# Patient Record
Sex: Female | Born: 1965
Health system: Southern US, Community
[De-identification: ages and names within clinical notes are randomized; demographics above are authoritative.]

## PROBLEM LIST (undated history)

## (undated) DIAGNOSIS — J309 Allergic rhinitis, unspecified: Secondary | ICD-10-CM

## (undated) HISTORY — DX: Allergic rhinitis, unspecified: J30.9

---

## 2004-12-16 ENCOUNTER — Ambulatory Visit: Payer: Self-pay | Admitting: Family Medicine

## 2006-10-22 ENCOUNTER — Ambulatory Visit: Payer: Self-pay | Admitting: Physician Assistant

## 2012-08-10 ENCOUNTER — Telehealth: Payer: Self-pay

## 2012-08-10 NOTE — Telephone Encounter (Signed)
error 

## 2015-05-10 ENCOUNTER — Ambulatory Visit
Admission: RE | Admit: 2015-05-10 | Discharge: 2015-05-10 | Disposition: A | Discharge status: Home / Self Care | Payer: BLUE CROSS/BLUE SHIELD | Source: Ambulatory Visit | Attending: Otolaryngology | Admitting: Otolaryngology

## 2015-05-10 ENCOUNTER — Other Ambulatory Visit: Payer: Self-pay | Admitting: Otolaryngology

## 2015-05-10 DIAGNOSIS — J328 Other chronic sinusitis: Secondary | ICD-10-CM

## 2016-02-25 DIAGNOSIS — Z1211 Encounter for screening for malignant neoplasm of colon: Secondary | ICD-10-CM | POA: Insufficient documentation

## 2016-02-25 DIAGNOSIS — F17200 Nicotine dependence, unspecified, uncomplicated: Secondary | ICD-10-CM | POA: Insufficient documentation

## 2016-02-25 DIAGNOSIS — E785 Hyperlipidemia, unspecified: Secondary | ICD-10-CM | POA: Insufficient documentation

## 2016-09-28 DIAGNOSIS — Z0000 Encounter for general adult medical examination without abnormal findings: Secondary | ICD-10-CM | POA: Insufficient documentation

## 2017-04-23 DIAGNOSIS — M5126 Other intervertebral disc displacement, lumbar region: Secondary | ICD-10-CM | POA: Insufficient documentation

## 2017-05-31 ENCOUNTER — Other Ambulatory Visit: Payer: Self-pay | Admitting: Orthopedic Surgery

## 2017-05-31 DIAGNOSIS — M545 Low back pain, unspecified: Secondary | ICD-10-CM

## 2017-06-03 ENCOUNTER — Ambulatory Visit (INDEPENDENT_AMBULATORY_CARE_PROVIDER_SITE_OTHER): Payer: Self-pay | Admitting: Orthopaedic Surgery

## 2017-06-04 ENCOUNTER — Ambulatory Visit
Admission: RE | Admit: 2017-06-04 | Discharge: 2017-06-04 | Disposition: A | Discharge status: Home / Self Care | Payer: BLUE CROSS/BLUE SHIELD | Source: Ambulatory Visit | Attending: Orthopedic Surgery | Admitting: Orthopedic Surgery

## 2017-06-04 VITALS — BP 126/81 | HR 94

## 2017-06-04 DIAGNOSIS — M5126 Other intervertebral disc displacement, lumbar region: Secondary | ICD-10-CM

## 2017-06-04 DIAGNOSIS — M545 Low back pain, unspecified: Secondary | ICD-10-CM

## 2017-06-04 MED ORDER — IOPAMIDOL (ISOVUE-M 200) INJECTION 41%
15.0000 mL | Freq: Once | INTRAMUSCULAR | Status: AC
  Administered 2017-06-04: 15 mL via INTRATHECAL

## 2017-06-04 MED ORDER — ONDANSETRON HCL 4 MG/2ML IJ SOLN
4.0000 mg | Freq: Once | INTRAMUSCULAR | Status: AC
  Administered 2017-06-04: 4 mg via INTRAMUSCULAR

## 2017-06-04 MED ORDER — DIAZEPAM 5 MG PO TABS
10.0000 mg | ORAL_TABLET | Freq: Once | ORAL | Status: AC
  Administered 2017-06-04: 10 mg via ORAL

## 2017-06-04 MED ORDER — MEPERIDINE HCL 100 MG/ML IJ SOLN
100.0000 mg | Freq: Once | INTRAMUSCULAR | Status: AC
  Administered 2017-06-04: 100 mg via INTRAMUSCULAR

## 2017-06-04 NOTE — Discharge Instructions (Signed)

## 2017-07-02 ENCOUNTER — Other Ambulatory Visit: Payer: Self-pay | Admitting: Orthopedic Surgery

## 2017-07-02 DIAGNOSIS — D4120 Neoplasm of uncertain behavior of unspecified ureter: Principal | ICD-10-CM

## 2017-07-02 DIAGNOSIS — D4100 Neoplasm of uncertain behavior of unspecified kidney: Secondary | ICD-10-CM

## 2017-07-05 ENCOUNTER — Ambulatory Visit
Admission: RE | Admit: 2017-07-05 | Discharge: 2017-07-05 | Disposition: A | Discharge status: Home / Self Care | Payer: BLUE CROSS/BLUE SHIELD | Source: Ambulatory Visit | Attending: Orthopedic Surgery | Admitting: Orthopedic Surgery

## 2017-07-05 ENCOUNTER — Other Ambulatory Visit (HOSPITAL_COMMUNITY): Payer: Self-pay | Admitting: Orthopedic Surgery

## 2017-07-05 DIAGNOSIS — D4100 Neoplasm of uncertain behavior of unspecified kidney: Secondary | ICD-10-CM

## 2017-07-05 DIAGNOSIS — M545 Low back pain, unspecified: Secondary | ICD-10-CM

## 2017-07-05 DIAGNOSIS — D4120 Neoplasm of uncertain behavior of unspecified ureter: Principal | ICD-10-CM

## 2017-07-05 MED ORDER — IOPAMIDOL (ISOVUE-300) INJECTION 61%
100.0000 mL | Freq: Once | INTRAVENOUS | Status: AC | PRN
  Administered 2017-07-05: 100 mL via INTRAVENOUS

## 2017-07-12 ENCOUNTER — Encounter (HOSPITAL_COMMUNITY)
Admission: RE | Admit: 2017-07-12 | Discharge: 2017-07-12 | Disposition: A | Discharge status: Home / Self Care | Payer: BLUE CROSS/BLUE SHIELD | Source: Ambulatory Visit | Attending: Orthopedic Surgery | Admitting: Orthopedic Surgery

## 2017-07-12 DIAGNOSIS — M545 Low back pain, unspecified: Secondary | ICD-10-CM

## 2017-07-12 MED ORDER — TECHNETIUM TC 99M MEDRONATE IV KIT
21.6000 | PACK | Freq: Once | INTRAVENOUS | Status: AC | PRN
  Administered 2017-07-12: 21.6 via INTRAVENOUS

## 2017-09-20 DIAGNOSIS — M5136 Other intervertebral disc degeneration, lumbar region: Secondary | ICD-10-CM | POA: Insufficient documentation

## 2017-11-26 DIAGNOSIS — M545 Low back pain: Secondary | ICD-10-CM | POA: Diagnosis not present

## 2017-11-26 DIAGNOSIS — M79605 Pain in left leg: Secondary | ICD-10-CM | POA: Diagnosis not present

## 2017-12-15 DIAGNOSIS — M25552 Pain in left hip: Secondary | ICD-10-CM | POA: Diagnosis not present

## 2017-12-15 DIAGNOSIS — S76302S Unspecified injury of muscle, fascia and tendon of the posterior muscle group at thigh level, left thigh, sequela: Secondary | ICD-10-CM | POA: Diagnosis not present

## 2017-12-20 DIAGNOSIS — M1612 Unilateral primary osteoarthritis, left hip: Secondary | ICD-10-CM | POA: Diagnosis not present

## 2017-12-20 DIAGNOSIS — S76319A Strain of muscle, fascia and tendon of the posterior muscle group at thigh level, unspecified thigh, initial encounter: Secondary | ICD-10-CM | POA: Diagnosis not present

## 2017-12-29 DIAGNOSIS — R262 Difficulty in walking, not elsewhere classified: Secondary | ICD-10-CM | POA: Diagnosis not present

## 2017-12-29 DIAGNOSIS — M545 Low back pain: Secondary | ICD-10-CM | POA: Diagnosis not present

## 2017-12-29 DIAGNOSIS — M25552 Pain in left hip: Secondary | ICD-10-CM | POA: Diagnosis not present

## 2017-12-29 DIAGNOSIS — M79605 Pain in left leg: Secondary | ICD-10-CM | POA: Diagnosis not present

## 2017-12-30 DIAGNOSIS — F411 Generalized anxiety disorder: Secondary | ICD-10-CM | POA: Insufficient documentation

## 2017-12-30 DIAGNOSIS — F321 Major depressive disorder, single episode, moderate: Secondary | ICD-10-CM | POA: Insufficient documentation

## 2017-12-30 DIAGNOSIS — S76302D Unspecified injury of muscle, fascia and tendon of the posterior muscle group at thigh level, left thigh, subsequent encounter: Secondary | ICD-10-CM | POA: Diagnosis not present

## 2017-12-31 DIAGNOSIS — M79605 Pain in left leg: Secondary | ICD-10-CM | POA: Diagnosis not present

## 2017-12-31 DIAGNOSIS — R262 Difficulty in walking, not elsewhere classified: Secondary | ICD-10-CM | POA: Diagnosis not present

## 2017-12-31 DIAGNOSIS — M545 Low back pain: Secondary | ICD-10-CM | POA: Diagnosis not present

## 2017-12-31 DIAGNOSIS — M25552 Pain in left hip: Secondary | ICD-10-CM | POA: Diagnosis not present

## 2018-01-03 DIAGNOSIS — M79605 Pain in left leg: Secondary | ICD-10-CM | POA: Diagnosis not present

## 2018-01-03 DIAGNOSIS — M25552 Pain in left hip: Secondary | ICD-10-CM | POA: Diagnosis not present

## 2018-01-03 DIAGNOSIS — R262 Difficulty in walking, not elsewhere classified: Secondary | ICD-10-CM | POA: Diagnosis not present

## 2018-01-03 DIAGNOSIS — M545 Low back pain: Secondary | ICD-10-CM | POA: Diagnosis not present

## 2018-01-05 DIAGNOSIS — M79605 Pain in left leg: Secondary | ICD-10-CM | POA: Diagnosis not present

## 2018-01-05 DIAGNOSIS — M25552 Pain in left hip: Secondary | ICD-10-CM | POA: Diagnosis not present

## 2018-01-05 DIAGNOSIS — R262 Difficulty in walking, not elsewhere classified: Secondary | ICD-10-CM | POA: Diagnosis not present

## 2018-01-05 DIAGNOSIS — M545 Low back pain: Secondary | ICD-10-CM | POA: Diagnosis not present

## 2018-01-07 DIAGNOSIS — M79605 Pain in left leg: Secondary | ICD-10-CM | POA: Diagnosis not present

## 2018-01-07 DIAGNOSIS — M25552 Pain in left hip: Secondary | ICD-10-CM | POA: Diagnosis not present

## 2018-01-07 DIAGNOSIS — M545 Low back pain: Secondary | ICD-10-CM | POA: Diagnosis not present

## 2018-01-07 DIAGNOSIS — R262 Difficulty in walking, not elsewhere classified: Secondary | ICD-10-CM | POA: Diagnosis not present

## 2018-01-10 DIAGNOSIS — M25552 Pain in left hip: Secondary | ICD-10-CM | POA: Diagnosis not present

## 2018-01-10 DIAGNOSIS — M545 Low back pain: Secondary | ICD-10-CM | POA: Diagnosis not present

## 2018-01-10 DIAGNOSIS — R262 Difficulty in walking, not elsewhere classified: Secondary | ICD-10-CM | POA: Diagnosis not present

## 2018-01-10 DIAGNOSIS — M79605 Pain in left leg: Secondary | ICD-10-CM | POA: Diagnosis not present

## 2018-01-11 DIAGNOSIS — L570 Actinic keratosis: Secondary | ICD-10-CM | POA: Diagnosis not present

## 2018-01-12 DIAGNOSIS — M545 Low back pain: Secondary | ICD-10-CM | POA: Diagnosis not present

## 2018-01-12 DIAGNOSIS — M25552 Pain in left hip: Secondary | ICD-10-CM | POA: Diagnosis not present

## 2018-01-12 DIAGNOSIS — R262 Difficulty in walking, not elsewhere classified: Secondary | ICD-10-CM | POA: Diagnosis not present

## 2018-01-12 DIAGNOSIS — M79605 Pain in left leg: Secondary | ICD-10-CM | POA: Diagnosis not present

## 2018-01-14 DIAGNOSIS — M545 Low back pain: Secondary | ICD-10-CM | POA: Diagnosis not present

## 2018-01-14 DIAGNOSIS — M25552 Pain in left hip: Secondary | ICD-10-CM | POA: Diagnosis not present

## 2018-01-14 DIAGNOSIS — M79605 Pain in left leg: Secondary | ICD-10-CM | POA: Diagnosis not present

## 2018-01-14 DIAGNOSIS — R262 Difficulty in walking, not elsewhere classified: Secondary | ICD-10-CM | POA: Diagnosis not present

## 2018-01-17 DIAGNOSIS — M25552 Pain in left hip: Secondary | ICD-10-CM | POA: Diagnosis not present

## 2018-01-17 DIAGNOSIS — R262 Difficulty in walking, not elsewhere classified: Secondary | ICD-10-CM | POA: Diagnosis not present

## 2018-01-17 DIAGNOSIS — M545 Low back pain: Secondary | ICD-10-CM | POA: Diagnosis not present

## 2018-01-17 DIAGNOSIS — M79605 Pain in left leg: Secondary | ICD-10-CM | POA: Diagnosis not present

## 2018-01-19 DIAGNOSIS — M25552 Pain in left hip: Secondary | ICD-10-CM | POA: Diagnosis not present

## 2018-01-19 DIAGNOSIS — R262 Difficulty in walking, not elsewhere classified: Secondary | ICD-10-CM | POA: Diagnosis not present

## 2018-01-19 DIAGNOSIS — M545 Low back pain: Secondary | ICD-10-CM | POA: Diagnosis not present

## 2018-01-19 DIAGNOSIS — M79605 Pain in left leg: Secondary | ICD-10-CM | POA: Diagnosis not present

## 2018-01-21 DIAGNOSIS — M79605 Pain in left leg: Secondary | ICD-10-CM | POA: Diagnosis not present

## 2018-01-21 DIAGNOSIS — M25552 Pain in left hip: Secondary | ICD-10-CM | POA: Diagnosis not present

## 2018-01-21 DIAGNOSIS — R262 Difficulty in walking, not elsewhere classified: Secondary | ICD-10-CM | POA: Diagnosis not present

## 2018-01-21 DIAGNOSIS — M545 Low back pain: Secondary | ICD-10-CM | POA: Diagnosis not present

## 2018-01-24 DIAGNOSIS — M79605 Pain in left leg: Secondary | ICD-10-CM | POA: Diagnosis not present

## 2018-01-24 DIAGNOSIS — M25552 Pain in left hip: Secondary | ICD-10-CM | POA: Diagnosis not present

## 2018-01-24 DIAGNOSIS — R262 Difficulty in walking, not elsewhere classified: Secondary | ICD-10-CM | POA: Diagnosis not present

## 2018-01-24 DIAGNOSIS — M545 Low back pain: Secondary | ICD-10-CM | POA: Diagnosis not present

## 2018-01-26 DIAGNOSIS — M79605 Pain in left leg: Secondary | ICD-10-CM | POA: Diagnosis not present

## 2018-01-26 DIAGNOSIS — R262 Difficulty in walking, not elsewhere classified: Secondary | ICD-10-CM | POA: Diagnosis not present

## 2018-01-26 DIAGNOSIS — M25552 Pain in left hip: Secondary | ICD-10-CM | POA: Diagnosis not present

## 2018-01-26 DIAGNOSIS — M545 Low back pain: Secondary | ICD-10-CM | POA: Diagnosis not present

## 2018-01-28 DIAGNOSIS — R262 Difficulty in walking, not elsewhere classified: Secondary | ICD-10-CM | POA: Diagnosis not present

## 2018-01-28 DIAGNOSIS — M79605 Pain in left leg: Secondary | ICD-10-CM | POA: Diagnosis not present

## 2018-01-28 DIAGNOSIS — M545 Low back pain: Secondary | ICD-10-CM | POA: Diagnosis not present

## 2018-01-28 DIAGNOSIS — M25552 Pain in left hip: Secondary | ICD-10-CM | POA: Diagnosis not present

## 2018-02-02 DIAGNOSIS — M25552 Pain in left hip: Secondary | ICD-10-CM | POA: Diagnosis not present

## 2018-02-02 DIAGNOSIS — R262 Difficulty in walking, not elsewhere classified: Secondary | ICD-10-CM | POA: Diagnosis not present

## 2018-02-02 DIAGNOSIS — M79605 Pain in left leg: Secondary | ICD-10-CM | POA: Diagnosis not present

## 2018-02-02 DIAGNOSIS — M545 Low back pain: Secondary | ICD-10-CM | POA: Diagnosis not present

## 2018-02-04 DIAGNOSIS — M545 Low back pain: Secondary | ICD-10-CM | POA: Diagnosis not present

## 2018-02-04 DIAGNOSIS — F411 Generalized anxiety disorder: Secondary | ICD-10-CM | POA: Diagnosis not present

## 2018-02-04 DIAGNOSIS — M25552 Pain in left hip: Secondary | ICD-10-CM | POA: Diagnosis not present

## 2018-02-04 DIAGNOSIS — R262 Difficulty in walking, not elsewhere classified: Secondary | ICD-10-CM | POA: Diagnosis not present

## 2018-02-04 DIAGNOSIS — F321 Major depressive disorder, single episode, moderate: Secondary | ICD-10-CM | POA: Diagnosis not present

## 2018-02-04 DIAGNOSIS — M79605 Pain in left leg: Secondary | ICD-10-CM | POA: Diagnosis not present

## 2018-02-04 DIAGNOSIS — S76302D Unspecified injury of muscle, fascia and tendon of the posterior muscle group at thigh level, left thigh, subsequent encounter: Secondary | ICD-10-CM | POA: Diagnosis not present

## 2018-02-07 DIAGNOSIS — R262 Difficulty in walking, not elsewhere classified: Secondary | ICD-10-CM | POA: Diagnosis not present

## 2018-02-07 DIAGNOSIS — M545 Low back pain: Secondary | ICD-10-CM | POA: Diagnosis not present

## 2018-02-07 DIAGNOSIS — M25552 Pain in left hip: Secondary | ICD-10-CM | POA: Diagnosis not present

## 2018-02-07 DIAGNOSIS — M79605 Pain in left leg: Secondary | ICD-10-CM | POA: Diagnosis not present

## 2018-02-16 DIAGNOSIS — Z008 Encounter for other general examination: Secondary | ICD-10-CM | POA: Diagnosis not present

## 2018-02-16 DIAGNOSIS — Z139 Encounter for screening, unspecified: Secondary | ICD-10-CM | POA: Diagnosis not present

## 2018-02-16 DIAGNOSIS — E559 Vitamin D deficiency, unspecified: Secondary | ICD-10-CM | POA: Diagnosis not present

## 2018-02-16 DIAGNOSIS — F17210 Nicotine dependence, cigarettes, uncomplicated: Secondary | ICD-10-CM | POA: Diagnosis not present

## 2018-02-16 DIAGNOSIS — Z716 Tobacco abuse counseling: Secondary | ICD-10-CM | POA: Diagnosis not present

## 2018-03-09 DIAGNOSIS — Z008 Encounter for other general examination: Secondary | ICD-10-CM | POA: Diagnosis not present

## 2018-03-09 DIAGNOSIS — Z719 Counseling, unspecified: Secondary | ICD-10-CM | POA: Diagnosis not present

## 2018-03-09 DIAGNOSIS — F17210 Nicotine dependence, cigarettes, uncomplicated: Secondary | ICD-10-CM | POA: Diagnosis not present

## 2018-03-09 DIAGNOSIS — Z716 Tobacco abuse counseling: Secondary | ICD-10-CM | POA: Diagnosis not present

## 2018-05-05 DIAGNOSIS — F411 Generalized anxiety disorder: Secondary | ICD-10-CM | POA: Diagnosis not present

## 2018-05-05 DIAGNOSIS — E785 Hyperlipidemia, unspecified: Secondary | ICD-10-CM | POA: Diagnosis not present

## 2018-05-05 DIAGNOSIS — F17210 Nicotine dependence, cigarettes, uncomplicated: Secondary | ICD-10-CM | POA: Diagnosis not present

## 2018-05-24 DIAGNOSIS — Z1231 Encounter for screening mammogram for malignant neoplasm of breast: Secondary | ICD-10-CM | POA: Diagnosis not present

## 2018-06-27 DIAGNOSIS — E559 Vitamin D deficiency, unspecified: Secondary | ICD-10-CM | POA: Diagnosis not present

## 2018-06-27 DIAGNOSIS — Z716 Tobacco abuse counseling: Secondary | ICD-10-CM | POA: Diagnosis not present

## 2018-06-27 DIAGNOSIS — F17210 Nicotine dependence, cigarettes, uncomplicated: Secondary | ICD-10-CM | POA: Diagnosis not present

## 2018-06-27 DIAGNOSIS — Z008 Encounter for other general examination: Secondary | ICD-10-CM | POA: Diagnosis not present

## 2018-06-27 DIAGNOSIS — Z719 Counseling, unspecified: Secondary | ICD-10-CM | POA: Diagnosis not present

## 2018-07-12 DIAGNOSIS — I788 Other diseases of capillaries: Secondary | ICD-10-CM | POA: Diagnosis not present

## 2018-07-12 DIAGNOSIS — L570 Actinic keratosis: Secondary | ICD-10-CM | POA: Diagnosis not present

## 2018-07-12 DIAGNOSIS — Z85828 Personal history of other malignant neoplasm of skin: Secondary | ICD-10-CM | POA: Diagnosis not present

## 2018-08-22 DIAGNOSIS — E559 Vitamin D deficiency, unspecified: Secondary | ICD-10-CM | POA: Diagnosis not present

## 2018-08-22 DIAGNOSIS — Z0130 Encounter for examination of blood pressure without abnormal findings: Secondary | ICD-10-CM | POA: Diagnosis not present

## 2018-08-22 DIAGNOSIS — R7301 Impaired fasting glucose: Secondary | ICD-10-CM | POA: Diagnosis not present

## 2018-08-22 DIAGNOSIS — E782 Mixed hyperlipidemia: Secondary | ICD-10-CM | POA: Diagnosis not present

## 2018-08-22 DIAGNOSIS — Z139 Encounter for screening, unspecified: Secondary | ICD-10-CM | POA: Diagnosis not present

## 2018-09-05 DIAGNOSIS — E559 Vitamin D deficiency, unspecified: Secondary | ICD-10-CM | POA: Diagnosis not present

## 2018-09-05 DIAGNOSIS — R7301 Impaired fasting glucose: Secondary | ICD-10-CM | POA: Diagnosis not present

## 2018-09-05 DIAGNOSIS — E782 Mixed hyperlipidemia: Secondary | ICD-10-CM | POA: Diagnosis not present

## 2018-09-05 DIAGNOSIS — R945 Abnormal results of liver function studies: Secondary | ICD-10-CM | POA: Diagnosis not present

## 2018-10-17 DIAGNOSIS — Z0130 Encounter for examination of blood pressure without abnormal findings: Secondary | ICD-10-CM | POA: Diagnosis not present

## 2018-10-17 DIAGNOSIS — R945 Abnormal results of liver function studies: Secondary | ICD-10-CM | POA: Diagnosis not present

## 2018-11-02 DIAGNOSIS — Z712 Person consulting for explanation of examination or test findings: Secondary | ICD-10-CM | POA: Diagnosis not present

## 2018-11-14 DIAGNOSIS — F17210 Nicotine dependence, cigarettes, uncomplicated: Secondary | ICD-10-CM | POA: Diagnosis not present

## 2018-11-14 DIAGNOSIS — Z716 Tobacco abuse counseling: Secondary | ICD-10-CM | POA: Diagnosis not present

## 2018-12-19 DIAGNOSIS — F17210 Nicotine dependence, cigarettes, uncomplicated: Secondary | ICD-10-CM | POA: Diagnosis not present

## 2018-12-19 DIAGNOSIS — R7301 Impaired fasting glucose: Secondary | ICD-10-CM | POA: Diagnosis not present

## 2018-12-19 DIAGNOSIS — E559 Vitamin D deficiency, unspecified: Secondary | ICD-10-CM | POA: Diagnosis not present

## 2018-12-19 DIAGNOSIS — E782 Mixed hyperlipidemia: Secondary | ICD-10-CM | POA: Diagnosis not present

## 2018-12-19 DIAGNOSIS — Z716 Tobacco abuse counseling: Secondary | ICD-10-CM | POA: Diagnosis not present

## 2018-12-19 DIAGNOSIS — Z008 Encounter for other general examination: Secondary | ICD-10-CM | POA: Diagnosis not present

## 2019-01-09 DIAGNOSIS — F17210 Nicotine dependence, cigarettes, uncomplicated: Secondary | ICD-10-CM | POA: Diagnosis not present

## 2019-01-09 DIAGNOSIS — Z716 Tobacco abuse counseling: Secondary | ICD-10-CM | POA: Diagnosis not present

## 2019-01-11 DIAGNOSIS — L819 Disorder of pigmentation, unspecified: Secondary | ICD-10-CM | POA: Diagnosis not present

## 2019-01-11 DIAGNOSIS — L570 Actinic keratosis: Secondary | ICD-10-CM | POA: Diagnosis not present

## 2019-01-11 DIAGNOSIS — Z85828 Personal history of other malignant neoplasm of skin: Secondary | ICD-10-CM | POA: Diagnosis not present

## 2019-01-23 DIAGNOSIS — Z716 Tobacco abuse counseling: Secondary | ICD-10-CM | POA: Diagnosis not present

## 2019-01-23 DIAGNOSIS — F17210 Nicotine dependence, cigarettes, uncomplicated: Secondary | ICD-10-CM | POA: Diagnosis not present

## 2019-01-31 DIAGNOSIS — Z975 Presence of (intrauterine) contraceptive device: Secondary | ICD-10-CM | POA: Diagnosis not present

## 2019-01-31 DIAGNOSIS — Z01419 Encounter for gynecological examination (general) (routine) without abnormal findings: Secondary | ICD-10-CM | POA: Diagnosis not present

## 2019-01-31 DIAGNOSIS — Z1211 Encounter for screening for malignant neoplasm of colon: Secondary | ICD-10-CM | POA: Diagnosis not present

## 2019-01-31 DIAGNOSIS — Z6824 Body mass index (BMI) 24.0-24.9, adult: Secondary | ICD-10-CM | POA: Diagnosis not present

## 2019-03-27 IMAGING — XA DG MYELOGRAPHY LUMBAR INJ LUMBOSACRAL
14 of 16 series · 14 of 16 positions shown · non-contrast
Comparison: MRI of the lumbar spine is 04/16/2017

CLINICAL DATA: Low back pain extending into the left hip and lower
extremity.
TECHNIQUE: Contiguous axial images were obtained through the Lumbar spine after
the intrathecal infusion of infusion. Coronal and sagittal
reconstructions were obtained of the axial image sets.

[Series 1: w lumbar spine lat · 0.15mm/px · 1 of 1 slices shown]
[im 1/1]
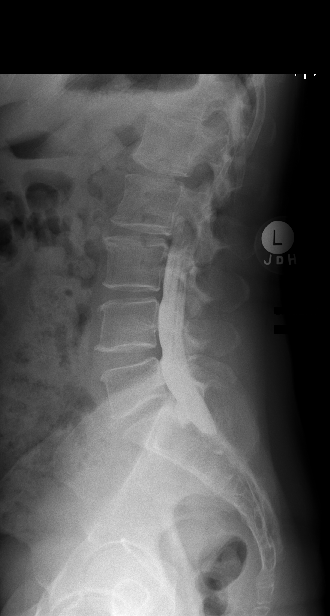

[Series 1: vasc standard · 1 of 1 slices shown (1 of 12)]
[im 1/1]
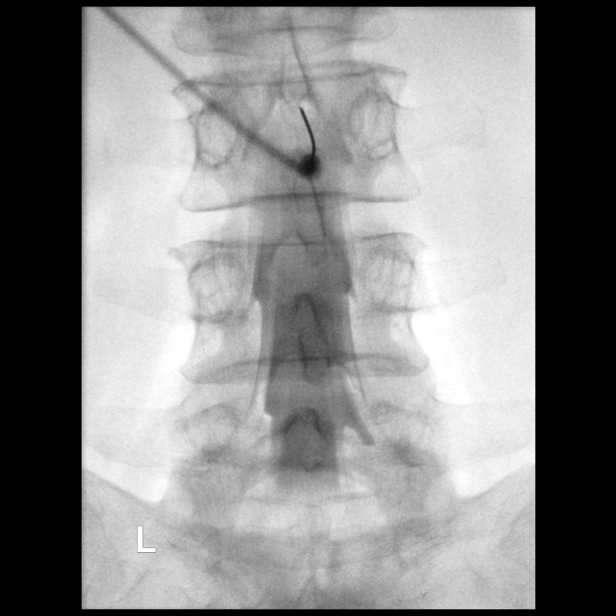

[Series 2: vasc standard · 1 of 1 slices shown (2 of 12)]
[im 1/1]
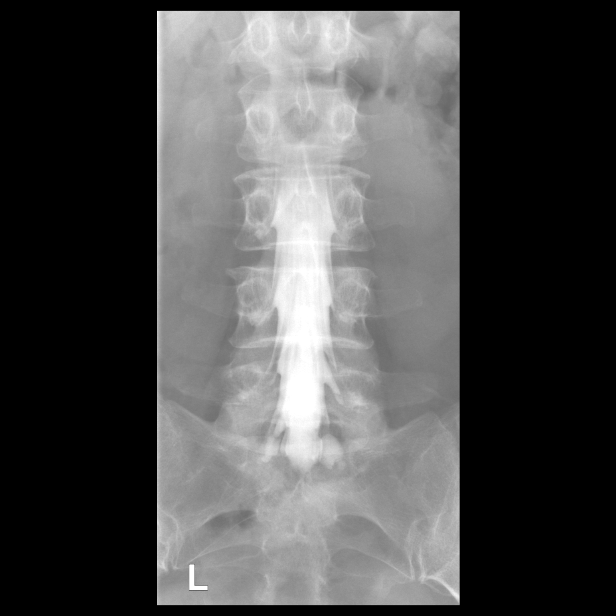

[Series 3: vasc standard · 1 of 1 slices shown (3 of 12)]
[im 1/1]
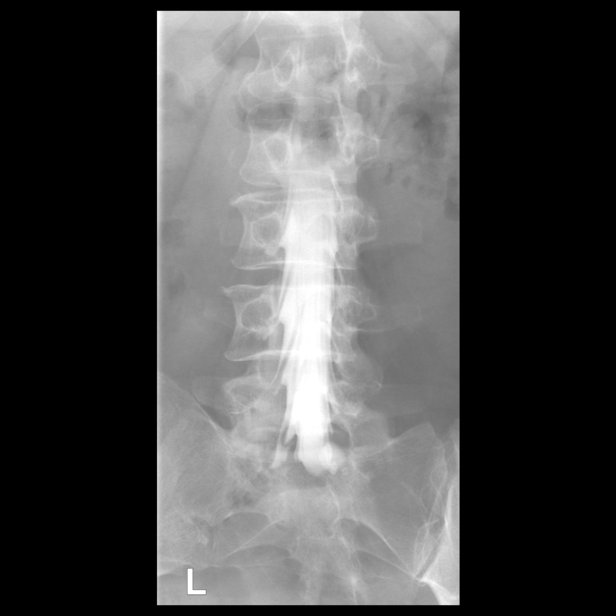

[Series 3: w lumbar spine extension · 0.15mm/px · 1 of 1 slices shown]
[im 1/1]
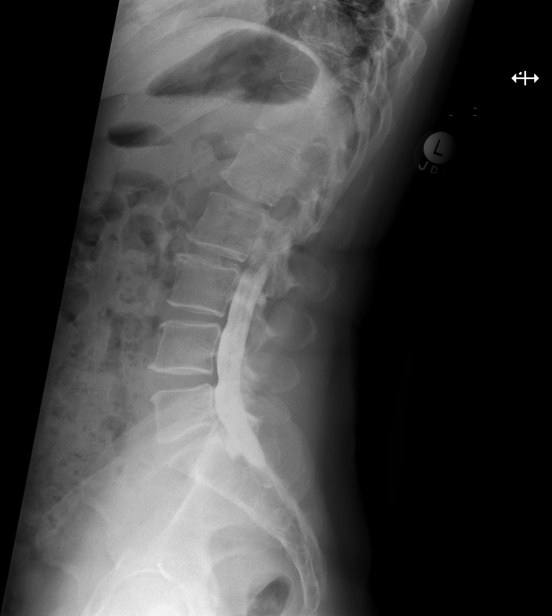

[Series 4: vasc standard · 1 of 1 slices shown (4 of 12)]
[im 1/1]
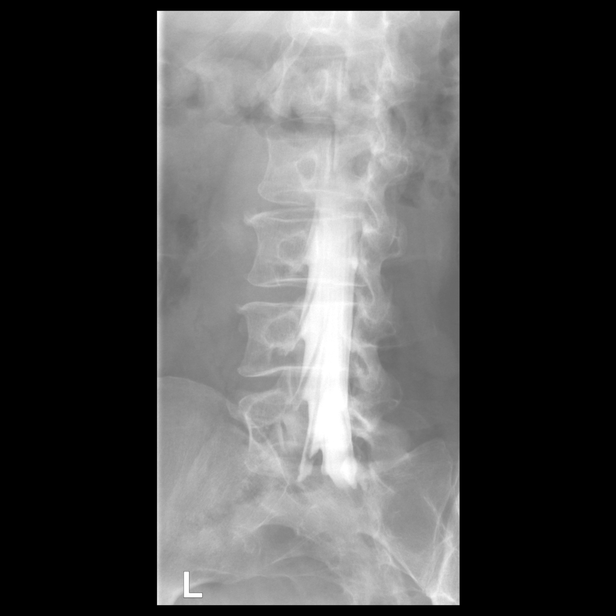

[Series 5: vasc standard · 1 of 1 slices shown (5 of 12)]
[im 1/1]
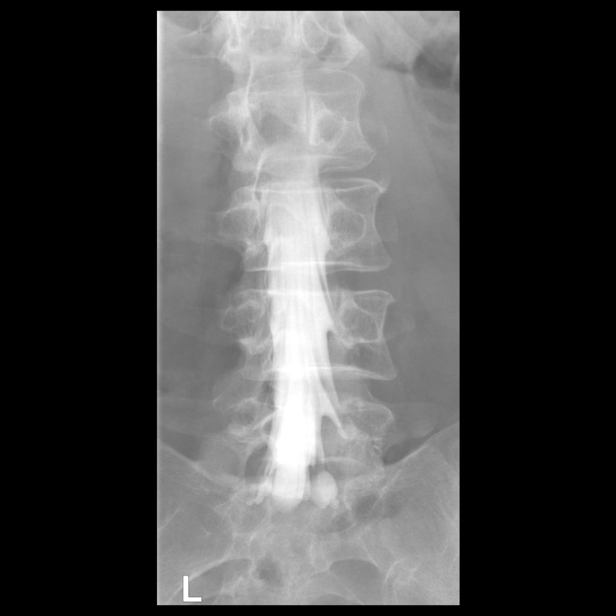

[Series 6: vasc standard · 1 of 1 slices shown (6 of 12)]
[im 1/1]
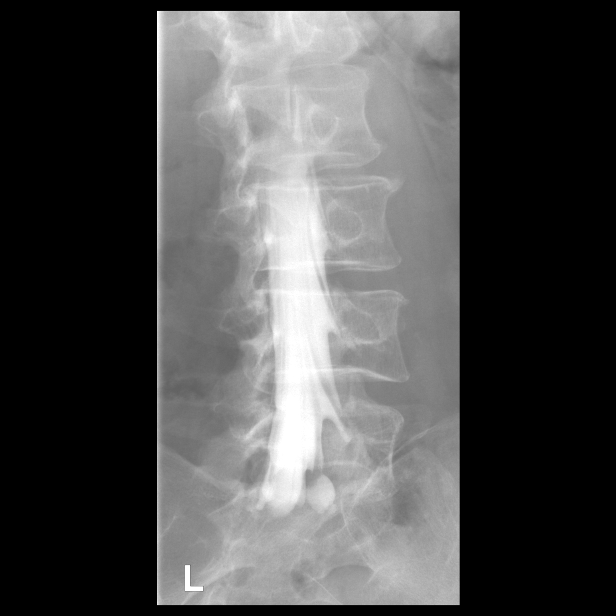

[Series 7: vasc standard · 1 of 1 slices shown (7 of 12)]
[im 1/1]
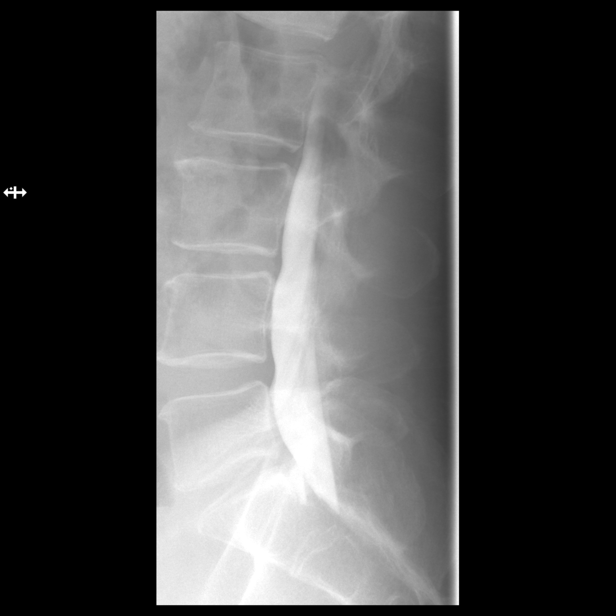

[Series 8: vasc standard · 1 of 1 slices shown (8 of 12)]
[im 1/1]
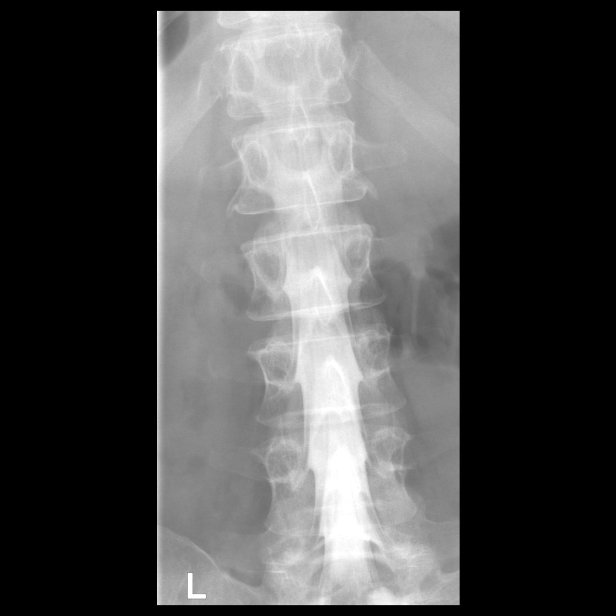

[Series 10: vasc standard · 1 of 1 slices shown (9 of 12)]
[im 1/1]
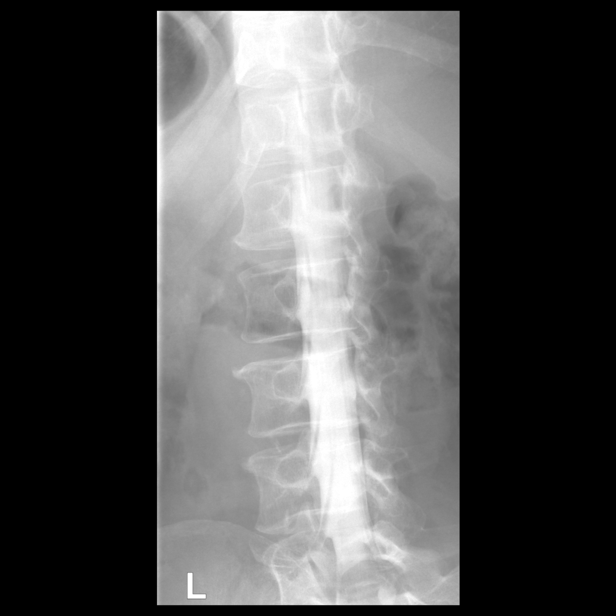

[Series 11: vasc standard · 1 of 1 slices shown (10 of 12)]
[im 1/1]
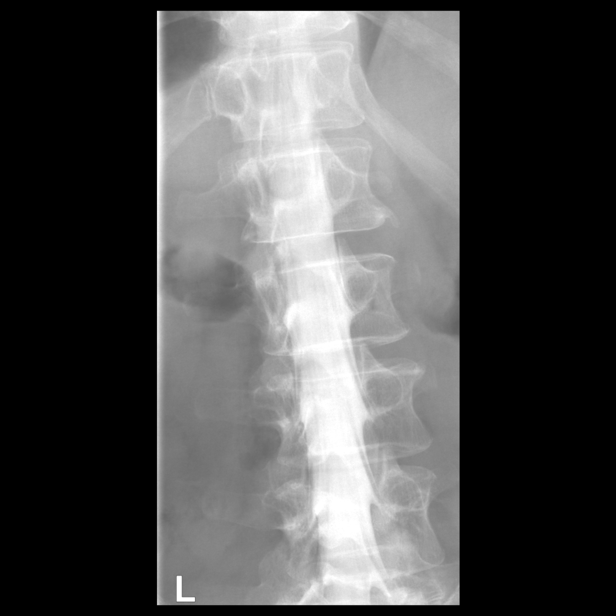

[Series 12: vasc standard · 1 of 1 slices shown (11 of 12)]
[im 1/1]
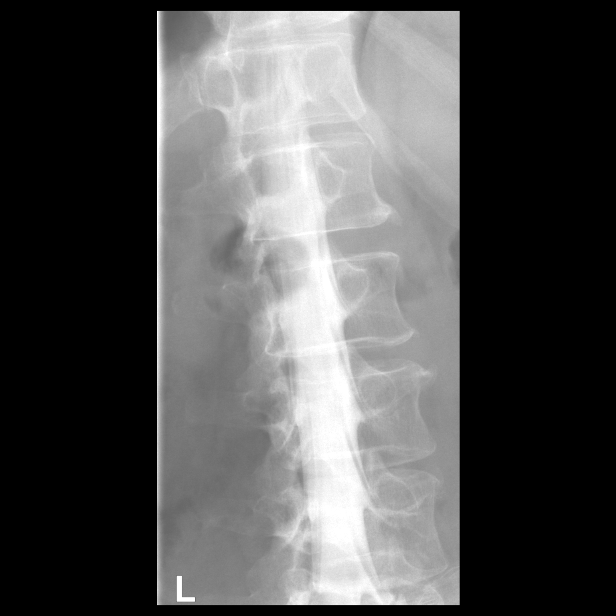

[Series 13: vasc standard · 1 of 1 slices shown (12 of 12)]
[im 1/1]
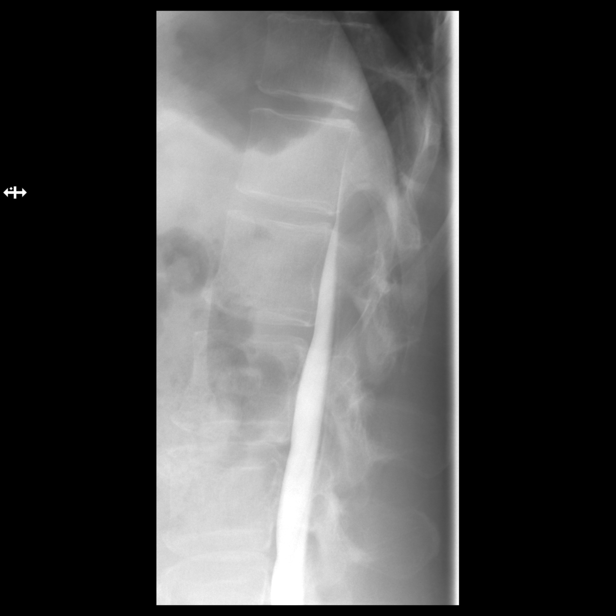

[14 of 16 positions shown; findings below may reference images not displayed]

EXAM:
LUMBAR MYELOGRAM

FLUOROSCOPY TIME:  Radiation Exposure Index (as provided by the
fluoroscopic device): 258.88 uGy*m2

Fluoroscopy Time:  40 seconds

Number of Acquired Images:  15

PROCEDURE:
After thorough discussion of risks and benefits of the procedure
including bleeding, infection, injury to nerves, blood vessels,
adjacent structures as well as headache and CSF leak, written and
oral informed consent was obtained. Consent was obtained by Dr.
Gildave Doneo. Time out form was completed.

Patient was positioned prone on the fluoroscopy table. Local
anesthesia was provided with 1% lidocaine without epinephrine after
prepped and draped in the usual sterile fashion. Puncture was
performed at L2-3 using a 3 1/2 inch 22-gauge spinal needle via
right paramedian approach. Using a single pass through the dura, the
needle was placed within the thecal sac, with return of clear CSF.
15 mL of Isovue M 200 was injected into the thecal sac, with normal
opacification of the nerve roots and cauda equina consistent with
free flow within the subarachnoid space.

I personally performed the lumbar puncture and administered the
intrathecal contrast. I also personally supervised acquisition of
the myelogram images.
FINDINGS: LUMBAR MYELOGRAM FINDINGS:

Five non rib-bearing lumbar type vertebral bodies are present.
Slight disc bulging is present at L4-5 and L5-S1. The L4-5 disc
protrusion is mildly exaggerated upon standing. Alignment is
maintained with standing. The nerve roots fill normally on both
sides. A perineural root sleeve cyst is present on the right at S1.

There is no abnormal motion with flexion or extension.

CT LUMBAR MYELOGRAM FINDINGS:

The lumbar spine is imaged from T11-12 through S3-4. Vertebral body
heights and alignment are maintained. Limited imaging of the abdomen
demonstrates minimal distal aortic atherosclerotic calcifications
without aneurysm. There is no significant adenopathy. No other focal
lesions are present.

No significant focal disc protrusion or stenosis is present. No
significant focal facet arthropathy is present. A right perineural
root sleeve cyst is present at S1, unlikely be of clinical
consequence to the patient. A smaller right S2 perineural root
sleeve cyst is present as well.
IMPRESSION: 1. Mild disc bulging at L4-5 with slight exaggeration after
standing. There is no significant associated stenosis.
2. No other significant changes with standing. No abnormal motion or
alignment.
3. Right perineural root sleeve cysts, S1 larger than S2.

## 2019-03-27 IMAGING — CT CT L SPINE W/ CM
1 of 6 series · 6 of 14 positions shown, 8 images · non-contrast
Comparison: MRI of the lumbar spine is 04/16/2017

CLINICAL DATA: Low back pain extending into the left hip and lower
extremity.
TECHNIQUE: Contiguous axial images were obtained through the Lumbar spine after
the intrathecal infusion of infusion. Coronal and sagittal
reconstructions were obtained of the axial image sets.

[Series 3: l spine soft · axial · 0.27mm/px · z∈[-313,-133]mm · 6 of 85 slices shown, 8 images]
[im 13/85  soft-tissue]
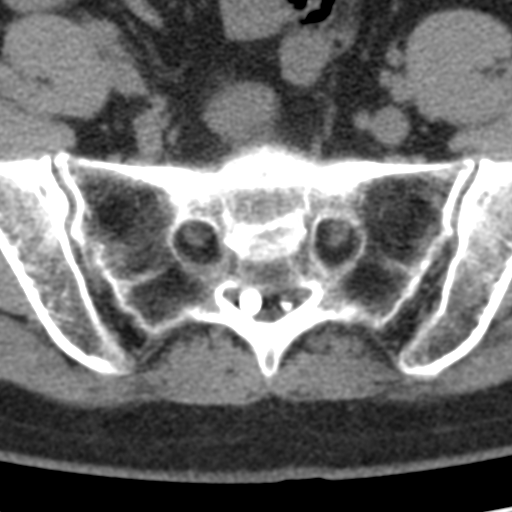
[im 13/85  bone]
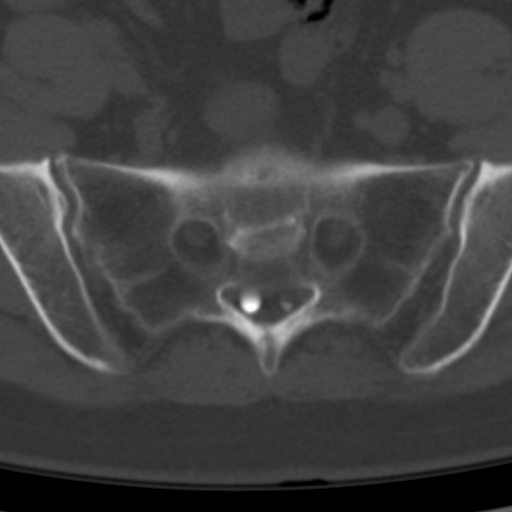
[im 25/85  bone]
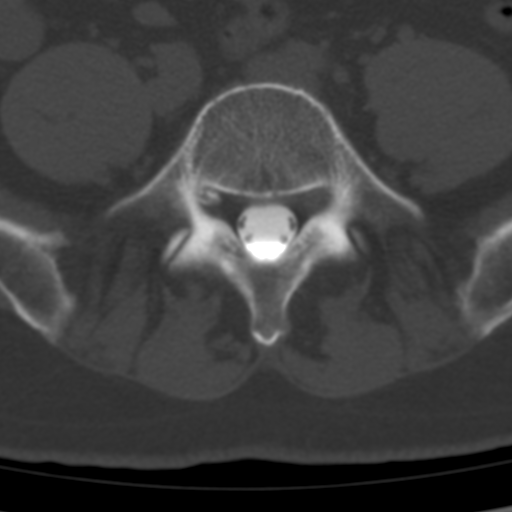
[im 37/85  bone]
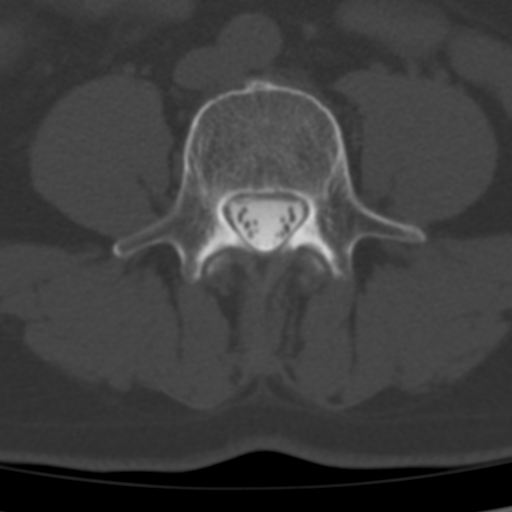
[im 49/85  bone]
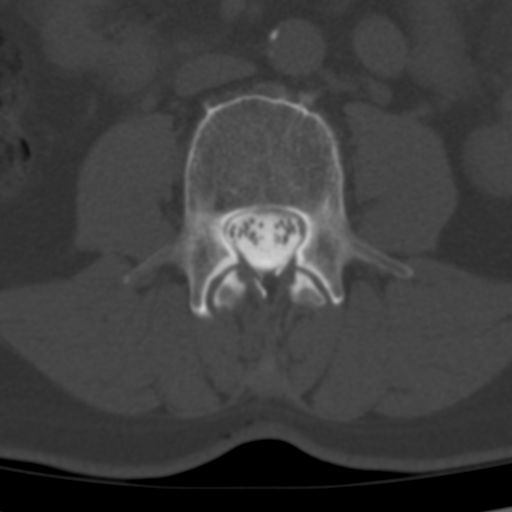
[im 61/85  soft-tissue]
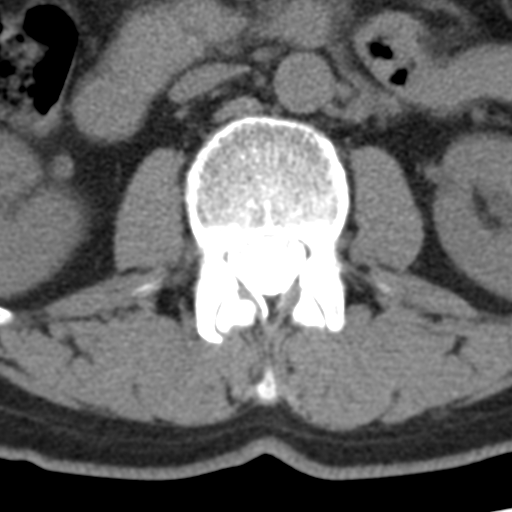
[im 61/85  bone]
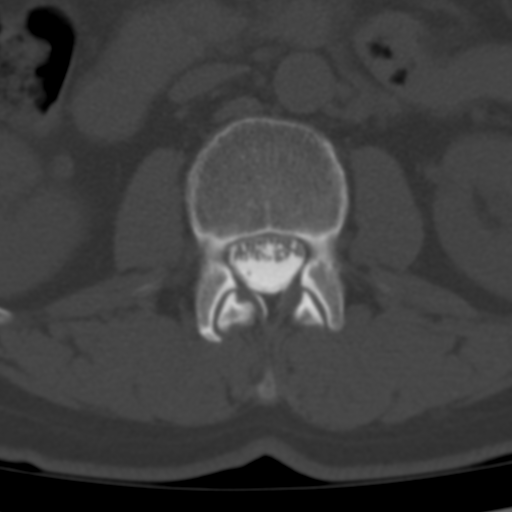
[im 73/85  bone]
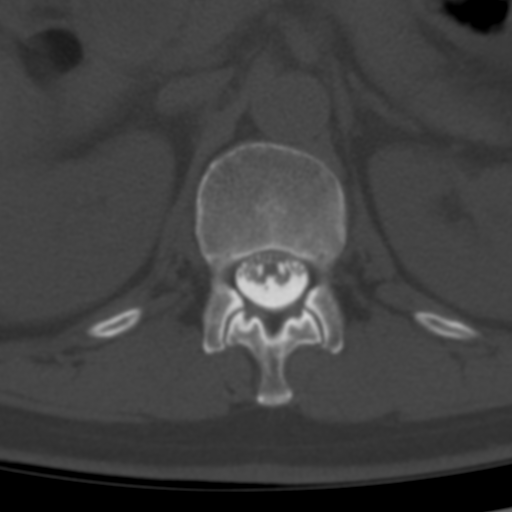

[6 of 14 positions shown; findings below may reference images not displayed]

EXAM:
LUMBAR MYELOGRAM

FLUOROSCOPY TIME:  Radiation Exposure Index (as provided by the
fluoroscopic device): 258.88 uGy*m2

Fluoroscopy Time:  40 seconds

Number of Acquired Images:  15

PROCEDURE:
After thorough discussion of risks and benefits of the procedure
including bleeding, infection, injury to nerves, blood vessels,
adjacent structures as well as headache and CSF leak, written and
oral informed consent was obtained. Consent was obtained by Dr.
Gildave Doneo. Time out form was completed.

Patient was positioned prone on the fluoroscopy table. Local
anesthesia was provided with 1% lidocaine without epinephrine after
prepped and draped in the usual sterile fashion. Puncture was
performed at L2-3 using a 3 1/2 inch 22-gauge spinal needle via
right paramedian approach. Using a single pass through the dura, the
needle was placed within the thecal sac, with return of clear CSF.
15 mL of Isovue M 200 was injected into the thecal sac, with normal
opacification of the nerve roots and cauda equina consistent with
free flow within the subarachnoid space.

I personally performed the lumbar puncture and administered the
intrathecal contrast. I also personally supervised acquisition of
the myelogram images.
FINDINGS: LUMBAR MYELOGRAM FINDINGS:

Five non rib-bearing lumbar type vertebral bodies are present.
Slight disc bulging is present at L4-5 and L5-S1. The L4-5 disc
protrusion is mildly exaggerated upon standing. Alignment is
maintained with standing. The nerve roots fill normally on both
sides. A perineural root sleeve cyst is present on the right at S1.

There is no abnormal motion with flexion or extension.

CT LUMBAR MYELOGRAM FINDINGS:

The lumbar spine is imaged from T11-12 through S3-4. Vertebral body
heights and alignment are maintained. Limited imaging of the abdomen
demonstrates minimal distal aortic atherosclerotic calcifications
without aneurysm. There is no significant adenopathy. No other focal
lesions are present.

No significant focal disc protrusion or stenosis is present. No
significant focal facet arthropathy is present. A right perineural
root sleeve cyst is present at S1, unlikely be of clinical
consequence to the patient. A smaller right S2 perineural root
sleeve cyst is present as well.
IMPRESSION: 1. Mild disc bulging at L4-5 with slight exaggeration after
standing. There is no significant associated stenosis.
2. No other significant changes with standing. No abnormal motion or
alignment.
3. Right perineural root sleeve cysts, S1 larger than S2.

## 2019-05-10 DIAGNOSIS — Z716 Tobacco abuse counseling: Secondary | ICD-10-CM | POA: Diagnosis not present

## 2019-05-10 DIAGNOSIS — F17210 Nicotine dependence, cigarettes, uncomplicated: Secondary | ICD-10-CM | POA: Diagnosis not present

## 2019-05-11 DIAGNOSIS — L570 Actinic keratosis: Secondary | ICD-10-CM | POA: Diagnosis not present

## 2019-05-11 DIAGNOSIS — Z85828 Personal history of other malignant neoplasm of skin: Secondary | ICD-10-CM | POA: Diagnosis not present

## 2019-06-14 DIAGNOSIS — Z1211 Encounter for screening for malignant neoplasm of colon: Secondary | ICD-10-CM | POA: Diagnosis not present

## 2019-06-14 DIAGNOSIS — Z6822 Body mass index (BMI) 22.0-22.9, adult: Secondary | ICD-10-CM | POA: Diagnosis not present

## 2019-06-20 DIAGNOSIS — Z1159 Encounter for screening for other viral diseases: Secondary | ICD-10-CM | POA: Diagnosis not present

## 2019-06-22 DIAGNOSIS — Z975 Presence of (intrauterine) contraceptive device: Secondary | ICD-10-CM | POA: Diagnosis not present

## 2019-06-22 DIAGNOSIS — Z1211 Encounter for screening for malignant neoplasm of colon: Secondary | ICD-10-CM | POA: Diagnosis not present

## 2019-06-22 DIAGNOSIS — Z79899 Other long term (current) drug therapy: Secondary | ICD-10-CM | POA: Diagnosis not present

## 2019-06-22 DIAGNOSIS — F17210 Nicotine dependence, cigarettes, uncomplicated: Secondary | ICD-10-CM | POA: Diagnosis not present

## 2019-06-22 DIAGNOSIS — K5730 Diverticulosis of large intestine without perforation or abscess without bleeding: Secondary | ICD-10-CM | POA: Diagnosis not present

## 2019-06-22 DIAGNOSIS — K640 First degree hemorrhoids: Secondary | ICD-10-CM | POA: Diagnosis not present

## 2019-06-22 DIAGNOSIS — K635 Polyp of colon: Secondary | ICD-10-CM | POA: Diagnosis not present

## 2019-07-12 DIAGNOSIS — Z1231 Encounter for screening mammogram for malignant neoplasm of breast: Secondary | ICD-10-CM | POA: Diagnosis not present

## 2019-07-12 DIAGNOSIS — Z85828 Personal history of other malignant neoplasm of skin: Secondary | ICD-10-CM | POA: Diagnosis not present

## 2019-07-12 DIAGNOSIS — L570 Actinic keratosis: Secondary | ICD-10-CM | POA: Diagnosis not present

## 2019-07-12 DIAGNOSIS — I788 Other diseases of capillaries: Secondary | ICD-10-CM | POA: Diagnosis not present

## 2019-07-17 DIAGNOSIS — Z1211 Encounter for screening for malignant neoplasm of colon: Secondary | ICD-10-CM | POA: Diagnosis not present

## 2019-07-24 DIAGNOSIS — F17210 Nicotine dependence, cigarettes, uncomplicated: Secondary | ICD-10-CM | POA: Diagnosis not present

## 2019-07-24 DIAGNOSIS — E782 Mixed hyperlipidemia: Secondary | ICD-10-CM | POA: Diagnosis not present

## 2019-07-24 DIAGNOSIS — E559 Vitamin D deficiency, unspecified: Secondary | ICD-10-CM | POA: Diagnosis not present

## 2019-07-24 DIAGNOSIS — Z716 Tobacco abuse counseling: Secondary | ICD-10-CM | POA: Diagnosis not present

## 2019-07-24 DIAGNOSIS — Z008 Encounter for other general examination: Secondary | ICD-10-CM | POA: Diagnosis not present

## 2019-07-24 DIAGNOSIS — Z719 Counseling, unspecified: Secondary | ICD-10-CM | POA: Diagnosis not present

## 2019-08-14 DIAGNOSIS — R7301 Impaired fasting glucose: Secondary | ICD-10-CM | POA: Diagnosis not present

## 2019-08-14 DIAGNOSIS — E559 Vitamin D deficiency, unspecified: Secondary | ICD-10-CM | POA: Diagnosis not present

## 2019-08-14 DIAGNOSIS — E782 Mixed hyperlipidemia: Secondary | ICD-10-CM | POA: Diagnosis not present

## 2019-08-14 DIAGNOSIS — Z139 Encounter for screening, unspecified: Secondary | ICD-10-CM | POA: Diagnosis not present

## 2019-08-28 DIAGNOSIS — E782 Mixed hyperlipidemia: Secondary | ICD-10-CM | POA: Diagnosis not present

## 2019-08-28 DIAGNOSIS — E559 Vitamin D deficiency, unspecified: Secondary | ICD-10-CM | POA: Diagnosis not present

## 2019-10-09 DIAGNOSIS — Z716 Tobacco abuse counseling: Secondary | ICD-10-CM | POA: Diagnosis not present

## 2019-10-09 DIAGNOSIS — F17210 Nicotine dependence, cigarettes, uncomplicated: Secondary | ICD-10-CM | POA: Diagnosis not present

## 2019-10-17 ENCOUNTER — Other Ambulatory Visit: Payer: Self-pay

## 2019-10-18 ENCOUNTER — Ambulatory Visit: Payer: BC Managed Care – PPO | Admitting: Family Medicine

## 2019-10-18 ENCOUNTER — Encounter: Payer: Self-pay | Admitting: Family Medicine

## 2019-10-18 VITALS — BP 121/72 | HR 86 | Temp 98.9°F | Ht 64.0 in | Wt 141.0 lb

## 2019-10-18 DIAGNOSIS — Z23 Encounter for immunization: Secondary | ICD-10-CM | POA: Diagnosis not present

## 2019-10-18 DIAGNOSIS — B351 Tinea unguium: Secondary | ICD-10-CM | POA: Diagnosis not present

## 2019-10-18 DIAGNOSIS — J301 Allergic rhinitis due to pollen: Secondary | ICD-10-CM | POA: Diagnosis not present

## 2019-10-18 DIAGNOSIS — Z720 Tobacco use: Secondary | ICD-10-CM | POA: Diagnosis not present

## 2019-10-18 DIAGNOSIS — E7800 Pure hypercholesterolemia, unspecified: Secondary | ICD-10-CM

## 2019-10-18 DIAGNOSIS — Z2821 Immunization not carried out because of patient refusal: Secondary | ICD-10-CM

## 2019-10-18 DIAGNOSIS — Z7689 Persons encountering health services in other specified circumstances: Secondary | ICD-10-CM

## 2019-10-18 NOTE — Patient Instructions (Signed)
Let me know if you want to do the chantix.  Fungal Nail Infection A fungal nail infection is a common infection of the toenails or fingernails. This condition affects toenails more often than fingernails. It often affects the great, or big, toes. More than one nail may be infected. The condition can be passed from person to person (is contagious). What are the causes? This condition is caused by a fungus. Several types of fungi can cause the infection. These fungi are common in moist and warm areas. If your hands or feet come into contact with the fungus, it may get into a crack in your fingernail or toenail and cause the infection. What increases the risk? The following factors may make you more likely to develop this condition:  Being female.  Being of older age.  Living with someone who has the fungus.  Walking barefoot in areas where the fungus thrives, such as showers or locker rooms.  Wearing shoes and socks that cause your feet to sweat.  Having a nail injury or a recent nail surgery.  Having certain medical conditions, such as: ? Athlete's foot. ? Diabetes. ? Psoriasis. ? Poor circulation. ? A weak body defense system (immune system). What are the signs or symptoms? Symptoms of this condition include:  A pale spot on the nail.  Thickening of the nail.  A nail that becomes yellow or brown.  A brittle or ragged nail edge.  A crumbling nail.  A nail that has lifted away from the nail bed. How is this diagnosed? This condition is diagnosed with a physical exam. Your health care provider may take a scraping or clipping from your nail to test for the fungus. How is this treated? Treatment is not needed for mild infections. If you have significant nail changes, treatment may include:  Antifungal medicines taken by mouth (orally). You may need to take the medicine for several weeks or several months, and you may not see the results for a long time. These medicines can  cause side effects. Ask your health care provider what problems to watch for.  Antifungal nail polish or nail cream. These may be used along with oral antifungal medicines.  Laser treatment of the nail.  Surgery to remove the nail. This may be needed for the most severe infections. It can take a long time, usually up to a year, for the infection to go away. The infection may also come back. Follow these instructions at home: Medicines  Take or apply over-the-counter and prescription medicines only as told by your health care provider.  Ask your health care provider about using over-the-counter mentholated ointment on your nails. Nail care  Trim your nails often.  Wash and dry your hands and feet every day.  Keep your feet dry: ? Wear absorbent socks, and change your socks frequently. ? Wear shoes that allow air to circulate, such as sandals or canvas tennis shoes. Throw out old shoes.  Do not use artificial nails.  If you go to a nail salon, make sure you choose one that uses clean instruments.  Use antifungal foot powder on your feet and in your shoes. General instructions  Do not share personal items, such as towels or nail clippers.  Do not walk barefoot in shower rooms or locker rooms.  Wear rubber gloves if you are working with your hands in wet areas.  Keep all follow-up visits as told by your health care provider. This is important. Contact a health care provider if:  Your infection is not getting better or it is getting worse after several months. Summary  A fungal nail infection is a common infection of the toenails or fingernails.  Treatment is not needed for mild infections. If you have significant nail changes, treatment may include taking medicine orally and applying medicine to your nails.  It can take a long time, usually up to a year, for the infection to go away. The infection may also come back.  Take or apply over-the-counter and prescription  medicines only as told by your health care provider.  Follow instructions for taking care of your nails to help prevent infection from coming back or spreading. This information is not intended to replace advice given to you by your health care provider. Make sure you discuss any questions you have with your health care provider. Document Released: 11/27/2000 Document Revised: 03/23/2019 Document Reviewed: 05/06/2018 Elsevier Patient Education  2020 [employer].

## 2019-10-18 NOTE — Progress Notes (Signed)
Subjective: [id number] care, allergic rhinitis, hyperlipidemia HPI: Candice Lozano is a 53 y.o. female presenting to clinic today for:  1.  Allergic rhinitis Patient reports good control of allergic rhinitis with Flonase.  Denies any sneezing, coughing, runny nose.  Overall symptoms were significantly better after her nasal septum deviation repair.  2.  Hyperlipidemia Patient noted to have an LDL of 127 on most recent laboratory draw at work.  This was performed on 08/14/2019.  She is not currently on any medications.  She reports being physically active and watching her diet.  Family history is significant for MI and congestive heart failure in her maternal grandfather.  She is an active every day smoker and smokes about 1/2 pack/day.  She has been a smoker for over 15 years.  She was recently prescribed nicotine patches and plans to start those soon.  She is had successful quit attempts in the past with Chantix but notes that it did have some psychedelic effects on her.  Her father died of lung cancer.  She denies any hemoptysis, shortness of breath, peripheral pain or discoloration, unplanned weight loss, wheezes, change in exercise tolerance.  3.  Nail discoloration.   She notes discoloration of the great toenail on the right.  She wants to have this checked out.  No current treatments.  No pain.  Past Medical History:  Diagnosis Date  . Allergic rhinitis    Past Surgical History:  Procedure Laterality Date  . NASAL SEPTUM SURGERY  2014   Social History   Socioeconomic History  . Marital status: Married    Spouse name: Not on file  . Number of children: Not on file  . Years of education: Not on file  . Highest education level: Not on file  Occupational History  . Not on file  Social Needs  . Financial resource strain: Not on file  . Food insecurity    Worry: Not on file    Inability: Not on file  . Transportation needs    Medical: Not on file    Non-medical: Not  on file  Tobacco Use  . Smoking status: Current Every Day Smoker    Packs/day: 0.50    Years: 15.00    Pack years: 7.50  . Smokeless tobacco: Never Used  Substance and Sexual Activity  . Alcohol use: Not on file    Comment: occ  . Drug use: Not Currently  . Sexual activity: Yes    Birth control/protection: I.U.D.  Lifestyle  . Physical activity    Days per week: Not on file    Minutes per session: Not on file  . Stress: Not on file  Relationships  . Social Herbalist on phone: Not on file    Gets together: Not on file    Attends religious service: Not on file    Active member of club or organization: Not on file    Attends meetings of clubs or organizations: Not on file    Relationship status: Not on file  . Intimate partner violence    Fear of current or ex partner: Not on file    Emotionally abused: Not on file    Physically abused: Not on file    Forced sexual activity: Not on file  Other Topics Concern  . Not on file  Social History Narrative   Patient is married.  She has no children.  She works locally at [employer]   Current [employer]  .  fluticasone (FLONASE) 50 MCG/ACT nasal spray USE 1 TO 2 SPRAY(S) IN EACH NOSTRIL ONCE DAILY   Family History  Problem Relation Age of Onset  . Diabetes Mother   . Diabetes Mellitus II Mother   . Lung cancer Father   . Brain cancer Maternal Grandmother   . Heart attack Maternal Grandfather   . Congestive Heart Failure Maternal Grandfather   . Aneurysm Paternal Grandmother    No Known Allergies   Health Maintenance: Tdap. Declines flu.  Colonoscopy done in [place] recently.  10-year follow-up.  Mammogram and Pap smear done recently.  She sees Dr. Adah Perl for OB/GYN.  ROS: Per HPI  Objective: Office vital signs reviewed. BP 121/72   Pulse 86   Temp 98.9 F (37.2 C) (Temporal)   Ht 5\' 4"  (1.626 m)   Wt 141 lb (64 kg)   SpO2 97%   BMI 24.20 kg/m   Physical Examination:  General: Awake, alert, well  nourished, No acute distress HEENT: Normal, sclera white, MMM Cardio: regular rate and rhythm, S1S2 heard, no murmurs appreciated Pulm: clear to auscultation bilaterally, no wheezes, rhonchi or rales; normal work of breathing on room air Extremities: warm, well perfused, No edema, cyanosis or clubbing; +2 pulses bilaterally MSK: normal gait and station Skin: dry; intact; no rashes or lesions  Nails: Right great toenail with onychomycotic changes. Neuro: Aox3. No focal neurologic deficits  Assessment/ Plan: 53 y.o. female   1. Tobacco use Counseling performed.  She is currently starting a patch.  She has previously failed lozenges.  Chantix was successful years ago but she did have some psychedelic manifestations from the medication  2. Establishing care with new doctor, encounter for I reviewed her care in the EMR.  Could not find previous hepatitis C screen.  Plan for this along with HIV screening during her next lab draw.  Recent labs obtained at work scanned into the chart.  LDL was noted to be slightly elevated.  3. Pure hypercholesterolemia LDL slightly elevated.  In combination with current tobacco use, could consider starting statin to reduce cardiac risk.  She is currently in the process of smoking cessation.  Plan to calculate her ASCVD risk or when she is successful to determine need for NSAID  4. Allergic rhinitis due to pollen, unspecified seasonality Controlled with Flonase  5. Onychomycosis of right great toe We discussed treatment options including topical treatments versus oral Lamisil.  She would like to pursue topical treatments first.  Handout provided.  Follow-up as needed  6. Influenza vaccination declined   Janora Norlander, DO [place] [phone]

## 2019-10-19 ENCOUNTER — Telehealth: Payer: Self-pay | Admitting: Family Medicine

## 2019-10-19 NOTE — Telephone Encounter (Signed)
Nurse aware and note filed.

## 2019-10-23 ENCOUNTER — Telehealth: Payer: Self-pay | Admitting: Family Medicine

## 2019-10-23 NOTE — Telephone Encounter (Signed)
She indicated that she did not want to take oral medications because she did not want to come back in for labs.   Did she change her mind?  The topical is available over the counter.

## 2019-10-23 NOTE — Telephone Encounter (Signed)
Patient aware to get lamisil otc.

## 2019-12-25 ENCOUNTER — Encounter: Payer: Self-pay | Admitting: Family Medicine

## 2020-01-15 DIAGNOSIS — L570 Actinic keratosis: Secondary | ICD-10-CM | POA: Diagnosis not present

## 2020-01-15 DIAGNOSIS — D239 Other benign neoplasm of skin, unspecified: Secondary | ICD-10-CM | POA: Diagnosis not present

## 2020-01-15 DIAGNOSIS — Z85828 Personal history of other malignant neoplasm of skin: Secondary | ICD-10-CM | POA: Diagnosis not present

## 2020-01-15 DIAGNOSIS — L819 Disorder of pigmentation, unspecified: Secondary | ICD-10-CM | POA: Diagnosis not present

## 2020-02-07 DIAGNOSIS — Z6823 Body mass index (BMI) 23.0-23.9, adult: Secondary | ICD-10-CM | POA: Diagnosis not present

## 2020-02-07 DIAGNOSIS — Z01419 Encounter for gynecological examination (general) (routine) without abnormal findings: Secondary | ICD-10-CM | POA: Diagnosis not present

## 2020-07-15 DIAGNOSIS — L570 Actinic keratosis: Secondary | ICD-10-CM | POA: Diagnosis not present

## 2020-07-15 DIAGNOSIS — Z85828 Personal history of other malignant neoplasm of skin: Secondary | ICD-10-CM | POA: Diagnosis not present

## 2020-07-29 DIAGNOSIS — Z1231 Encounter for screening mammogram for malignant neoplasm of breast: Secondary | ICD-10-CM | POA: Diagnosis not present

## 2021-01-15 DIAGNOSIS — L570 Actinic keratosis: Secondary | ICD-10-CM | POA: Diagnosis not present

## 2021-01-15 DIAGNOSIS — Z85828 Personal history of other malignant neoplasm of skin: Secondary | ICD-10-CM | POA: Diagnosis not present

## 2021-02-28 DIAGNOSIS — Z1151 Encounter for screening for human papillomavirus (HPV): Secondary | ICD-10-CM | POA: Diagnosis not present

## 2021-02-28 DIAGNOSIS — Z01419 Encounter for gynecological examination (general) (routine) without abnormal findings: Secondary | ICD-10-CM | POA: Diagnosis not present

## 2021-02-28 DIAGNOSIS — Z6824 Body mass index (BMI) 24.0-24.9, adult: Secondary | ICD-10-CM | POA: Diagnosis not present

## 2021-02-28 DIAGNOSIS — Z6830 Body mass index (BMI) 30.0-30.9, adult: Secondary | ICD-10-CM | POA: Diagnosis not present

## 2021-05-29 DIAGNOSIS — L600 Ingrowing nail: Secondary | ICD-10-CM | POA: Diagnosis not present

## 2021-05-29 DIAGNOSIS — B351 Tinea unguium: Secondary | ICD-10-CM | POA: Diagnosis not present

## 2021-05-30 DIAGNOSIS — B351 Tinea unguium: Secondary | ICD-10-CM | POA: Diagnosis not present

## 2021-06-06 ENCOUNTER — Ambulatory Visit: Payer: BC Managed Care – PPO | Admitting: Nurse Practitioner

## 2021-07-07 ENCOUNTER — Encounter: Payer: Self-pay | Admitting: Family Medicine

## 2021-07-07 ENCOUNTER — Other Ambulatory Visit: Payer: Self-pay

## 2021-07-07 ENCOUNTER — Ambulatory Visit: Payer: BC Managed Care – PPO | Admitting: Family Medicine

## 2021-07-07 VITALS — BP 145/90 | HR 112 | Temp 97.6°F | Ht 64.0 in | Wt 136.8 lb

## 2021-07-07 DIAGNOSIS — F329 Major depressive disorder, single episode, unspecified: Secondary | ICD-10-CM | POA: Diagnosis not present

## 2021-07-07 DIAGNOSIS — F430 Acute stress reaction: Secondary | ICD-10-CM

## 2021-07-07 DIAGNOSIS — F411 Generalized anxiety disorder: Secondary | ICD-10-CM | POA: Diagnosis not present

## 2021-07-07 MED ORDER — LORAZEPAM 0.5 MG PO TABS: 0.5000 mg | ORAL_TABLET | Freq: Two times a day (BID) | ORAL | 0 refills | Status: DC | PRN

## 2021-07-07 MED ORDER — SERTRALINE HCL 50 MG PO TABS: 50.0000 mg | ORAL_TABLET | Freq: Every day | ORAL | 5 refills | Status: DC

## 2021-07-07 NOTE — Patient Instructions (Signed)
Irenic Therapy Located in: [facility] Address: [address], [place], [place] [address] Phone: [phone]   Taking the medicine as directed and not missing any doses is one of the best things you can do to treat your depression, anxiety.  Here are some things to keep in mind:  Side effects (stomach upset, some increased anxiety) may happen before you notice a benefit.  These side effects typically go away over time. Changes to your dose of medicine or a change in medication all together is sometimes necessary Most people need to be on medication at least 12 months Many people will notice an improvement within two weeks but the full effect of the medication can take up to 4-6 weeks Stopping the medication when you start feeling better often results in a return of symptoms Never discontinue your medication without contacting a health care professional first.  Some medications require gradual discontinuation/ taper and can make you sick if you stop them abruptly.  If your symptoms worsen or you have thoughts of suicide/homicide, PLEASE SEEK IMMEDIATE MEDICAL ATTENTION.  You may always call:  National Suicide Hotline: 940 720 2649 [place]: 520-728-6517 Crisis Recovery in [place]: [phone])465-1962   These are available 24 hours a day, 7 days a week.

## 2021-07-07 NOTE — Progress Notes (Addendum)
Subjective: CC: Acute stress reaction, situational/reactive depression PCP: Janora Norlander, DO [id number] Candice Lozano is a 55 y.o. female presenting to clinic today for:  1.  Acute stress reaction/situational depression Patient reports that she has most recently decided to divorce her spouse.  She has feelings of guilt and failure over her marriage ending.  She in fact has seen a lawyer but has not discussed this with anybody but her mother.  She has always "been a strong person" and never required treatment for anxiety or depression but certainly feels that she needs something now.  Denies any HI, SI but does admit to difficulty with sleep.  She was prescribed Flexeril and Valium many years ago for a musculoskeletal issue and wonders if something similar would be appropriate to help her with sleep.  Does not really wish to be on something daily but will take something daily if needed.  She is very reluctant to take any medications and cites that she in fact did not even want to take a medication for her nail fungus because of possible side effects.  She is not yet ready to see a therapist and really has been keeping to herself about these recent changes.  Currently her support system is her mother.   ROS: Per HPI  No Known Allergies Past Medical History:  Diagnosis Date   Allergic rhinitis     Current Outpatient Medications:    fluticasone (FLONASE) 50 MCG/ACT nasal spray, USE 1 TO 2 SPRAY(S) IN EACH NOSTRIL ONCE DAILY, Disp: , Rfl:  Social History   Socioeconomic History   Marital status: Married    Spouse name: Not on file   Number of children: Not on file   Years of education: Not on file   Highest education level: Not on file  Occupational History   Not on file  Tobacco Use   Smoking status: Every Day    Packs/day: 0.50    Years: 15.00    Pack years: 7.50    Types: Cigarettes   Smokeless tobacco: Never  Vaping Use   Vaping Use: Never used  Substance and Sexual  Activity   Alcohol use: Not on file    Comment: occ   Drug use: Not Currently   Sexual activity: Yes    Birth control/protection: I.U.D.  Other Topics Concern   Not on file  Social History Narrative   Patient is married.  She has no children.  She works locally at [employer]: Not on [employer] Insecurity: Not on file  Transportation Needs: Not on file  Physical Activity: Not on file  Stress: Not on file  Social Connections: Not on file  Intimate Partner Violence: Not on file   Family History  Problem Relation Age of Onset   Diabetes Mother    Diabetes Mellitus II Mother    Lung cancer Father    Brain cancer Maternal Grandmother    Heart attack Maternal Grandfather    Congestive Heart Failure Maternal Grandfather    Aneurysm Paternal Grandmother     Objective: Office vital signs reviewed. BP (!) 145/90   Pulse (!) 112   Temp 97.6 F (36.4 C)   Ht '5\' 4"'$  (1.626 m)   Wt 136 lb 12.8 oz (62.1 kg)   SpO2 94%   BMI 23.48 kg/m   Physical Examination:  General: Awake, alert, upset Psych: Patient very tearful.  Eye contact is fair.  Depression screen PHQ  2/9 07/07/2021 10/18/2019  Decreased Interest 3 0  Down, Depressed, Hopeless 2 0  PHQ - 2 Score 5 0  Altered sleeping 3 0  Tired, decreased energy 2 0  Change in appetite 3 0  Feeling bad or failure about yourself  2 0  Trouble concentrating 3 0  Moving slowly or fidgety/restless 1 0  Suicidal thoughts 0 0  PHQ-9 Score 19 0   GAD 7 : Generalized Anxiety Score 07/07/2021  Nervous, Anxious, on Edge 3  Control/stop worrying 3  Worry too much - different things 2  Trouble relaxing 3  Restless 2  Easily annoyed or irritable 2  Afraid - awful might happen 3  Total GAD 7 Score 18  Anxiety Difficulty Very difficult      Assessment/ Plan: 55 y.o. female   Reactive depression - Plan: sertraline (ZOLOFT) 50 MG tablet  Anxiety in acute stress reaction - Plan:  sertraline (ZOLOFT) 50 MG tablet, LORazepam (ATIVAN) 0.5 MG tablet'  Patient experiencing an understandably very stressful situation.  I highly recommend that she start an SSRI.  I do anticipate that her symptoms will likely continue throughout the process of separating from her spouse.  We discussed that often patients require medication for about a year.  However, I will be glad to trial/taper her off when she feels that she is ready to do so.  I am also giving her short acting anxiolytic.  I discussed with her the risks of the medications.  Caution sedation.  Advised against use when needing to operate heavy machinery.  No alcohol use with medicine.  She will follow-up in the next 4 to 6 weeks for recheck, sooner if needed.  I offered her counseling information for someone locally.  No orders of the defined types were placed in this encounter.  No orders of the defined types were placed in this encounter.   The Narcotic Database has been reviewed.  There were no red flags.    Janora Norlander, DO [place] [phone]

## 2021-07-22 DIAGNOSIS — C44319 Basal cell carcinoma of skin of other parts of face: Secondary | ICD-10-CM | POA: Diagnosis not present

## 2021-07-22 DIAGNOSIS — D485 Neoplasm of uncertain behavior of skin: Secondary | ICD-10-CM | POA: Diagnosis not present

## 2021-07-22 DIAGNOSIS — L570 Actinic keratosis: Secondary | ICD-10-CM | POA: Diagnosis not present

## 2021-08-07 DIAGNOSIS — C44319 Basal cell carcinoma of skin of other parts of face: Secondary | ICD-10-CM | POA: Diagnosis not present

## 2021-08-07 DIAGNOSIS — Z1231 Encounter for screening mammogram for malignant neoplasm of breast: Secondary | ICD-10-CM | POA: Diagnosis not present

## 2021-09-04 ENCOUNTER — Telehealth: Payer: Self-pay | Admitting: Family Medicine

## 2021-09-04 ENCOUNTER — Other Ambulatory Visit: Payer: Self-pay

## 2021-09-04 DIAGNOSIS — E7800 Pure hypercholesterolemia, unspecified: Secondary | ICD-10-CM

## 2021-09-04 MED ORDER — ESCITALOPRAM OXALATE 10 MG PO TABS: 10.0000 mg | ORAL_TABLET | Freq: Every day | ORAL | 5 refills | Status: DC

## 2021-09-04 NOTE — Telephone Encounter (Signed)
Spoke with patient she is aware Dr. Lajuana Ripple is off today and want to wait for her return to advise. Patient has been having diarrhea for 3 weeks. She has no change in diet or meds other that the medication Dr. Lajuana Ripple put her on 2 mths ago. She deny any stomach pain or fever. She states no there symptoms other than the diarrhea. Patient states she is going 4-5 times a day. She is eating and drinking fine. Advise to stay hydrated and take imodium as need. Please advise once you return. Patient thinks it is the nerve med.

## 2021-09-04 NOTE — Telephone Encounter (Signed)
Ok to have her DC Zoloft.  Send in Lexapro 10mg  and have her start that in 2 days.

## 2021-09-04 NOTE — Telephone Encounter (Signed)
Pt aware of provider feedback and voiced understanding. Rx for Lexapro 10mg  sent into pharmacy.

## 2022-01-19 ENCOUNTER — Ambulatory Visit: Payer: BC Managed Care – PPO | Admitting: Family Medicine

## 2022-01-19 ENCOUNTER — Encounter: Payer: Self-pay | Admitting: Family Medicine

## 2022-01-19 VITALS — BP 134/88 | HR 93 | Temp 97.8°F | Ht 64.0 in | Wt 134.8 lb

## 2022-01-19 DIAGNOSIS — Z130 Encounter for screening for diseases of the blood and blood-forming organs and certain disorders involving the immune mechanism: Secondary | ICD-10-CM

## 2022-01-19 DIAGNOSIS — F329 Major depressive disorder, single episode, unspecified: Secondary | ICD-10-CM

## 2022-01-19 DIAGNOSIS — F430 Acute stress reaction: Secondary | ICD-10-CM

## 2022-01-19 DIAGNOSIS — Z79899 Other long term (current) drug therapy: Secondary | ICD-10-CM

## 2022-01-19 DIAGNOSIS — E7800 Pure hypercholesterolemia, unspecified: Secondary | ICD-10-CM

## 2022-01-19 DIAGNOSIS — Z79891 Long term (current) use of opiate analgesic: Secondary | ICD-10-CM | POA: Diagnosis not present

## 2022-01-19 DIAGNOSIS — F411 Generalized anxiety disorder: Secondary | ICD-10-CM | POA: Diagnosis not present

## 2022-01-19 MED ORDER — LORAZEPAM 0.5 MG PO TABS: 0.5000 mg | ORAL_TABLET | Freq: Two times a day (BID) | ORAL | 1 refills | Status: DC | PRN

## 2022-01-19 MED ORDER — MIRTAZAPINE 7.5 MG PO TABS: 7.5000 mg | ORAL_TABLET | Freq: Every day | ORAL | 1 refills | Status: DC

## 2022-01-19 NOTE — Progress Notes (Signed)
Subjective: CC: Depression, anxiety PCP: Janora Norlander, DO TMA:[id number] Candice Lozano is a 56 y.o. female presenting to clinic today for:  1.  Reactive depression, panic Patient reports that she is still going through divorce and in fact got a lawyer involved recently.  She had been doing better up until needing the lawyer involved and she subsequently has gotten a little bit more anxious.  She still has 1 tablet of the Ativan left is really been trying to use this very sparingly.  Denies any excessive daytime sedation.  She has had some difficulty sleeping.  The Lexapro caused her some irregular bowel movements and therefore she discontinued it.  Zoloft had caused diarrhea which is why we switched her to the Lexapro.  Bowel movements have since normalized since weaning from Lexapro.  She feels like she has excellent support from her friends and family.   ROS: Per HPI  No Known Allergies Past Medical History:  Diagnosis Date   Allergic rhinitis     Current Outpatient Medications:    escitalopram (LEXAPRO) 10 MG tablet, Take 1 tablet (10 mg total) by mouth daily., Disp: 30 tablet, Rfl: 5   fluticasone (FLONASE) 50 MCG/ACT nasal spray, USE 1 TO 2 SPRAY(S) IN EACH NOSTRIL ONCE DAILY, Disp: , Rfl:    LORazepam (ATIVAN) 0.5 MG tablet, Take 1 tablet (0.5 mg total) by mouth 2 (two) times daily as needed for anxiety. CAUTION sedation, Disp: 30 tablet, Rfl: 0 Social History   Socioeconomic History   Marital status: Married    Spouse name: Not on file   Number of children: Not on file   Years of education: Not on file   Highest education level: Not on file  Occupational History   Not on file  Tobacco Use   Smoking status: Every Day    Packs/day: 0.50    Years: 15.00    Pack years: 7.50    Types: Cigarettes   Smokeless tobacco: Never  Vaping Use   Vaping Use: Never used  Substance and Sexual Activity   Alcohol use: Not on file    Comment: occ   Drug use: Not Currently   Sexual  activity: Yes    Birth control/protection: I.U.D.  Other Topics Concern   Not on file  Social History Narrative   Patient is married.  She has no children.  She works locally at [employer]: Not on [employer] Insecurity: Not on file  Transportation Needs: Not on file  Physical Activity: Not on file  Stress: Not on file  Social Connections: Not on file  Intimate Partner Violence: Not on file   Family History  Problem Relation Age of Onset   Diabetes Mother    Diabetes Mellitus II Mother    Lung cancer Father    Brain cancer Maternal Grandmother    Heart attack Maternal Grandfather    Congestive Heart Failure Maternal Grandfather    Aneurysm Paternal Grandmother     Objective: Office vital signs reviewed. BP 134/88    Pulse 93    Temp 97.8 F (36.6 C)    Ht 5' 4"  (1.626 m)    Wt 134 lb 12.8 oz (61.1 kg)    SpO2 96%    BMI 23.14 kg/m   Physical Examination:  General: Awake, alert, well nourished, No acute distress HEENT: Sclera injected Cardio: regular rate and rhythm  Pulm:  normal work of breathing on room air Psych:  Mood somewhat depressed.  She is intermittently tearful.  Very pleasant, interactive.  Depression screen [facility] 2/9 01/19/2022 07/07/2021 10/18/2019  Decreased Interest 2 3 0  Down, Depressed, Hopeless 2 2 0  PHQ - 2 Score 4 5 0  Altered sleeping 1 3 0  Tired, decreased energy 2 2 0  Change in appetite 2 3 0  Feeling bad or failure about yourself  0 2 0  Trouble concentrating 3 3 0  Moving slowly or fidgety/restless 0 1 0  Suicidal thoughts 0 0 0  PHQ-9 Score 12 19 0  Difficult doing work/chores Somewhat difficult - -   GAD 7 : Generalized Anxiety Score 01/19/2022 07/07/2021  Nervous, Anxious, on Edge 3 3  Control/stop worrying 2 3  Worry too much - different things 2 2  Trouble relaxing 2 3  Restless 2 2  Easily annoyed or irritable 3 2  Afraid - awful might happen 2 3  Total GAD 7 Score 16 18   Anxiety Difficulty Somewhat difficult Very difficult      Assessment/ Plan: 56 y.o. female   Reactive depression - Plan: ToxASSURE Select 13 (MW), Urine, mirtazapine (REMERON) 7.5 MG tablet  Anxiety in acute stress reaction - Plan: ToxASSURE Select 13 (MW), Urine, LORazepam (ATIVAN) 0.5 MG tablet, mirtazapine (REMERON) 7.5 MG tablet  Controlled substance agreement signed - Plan: ToxASSURE Select 13 (MW), Urine  Pure hypercholesterolemia - Plan: Lipid panel, CMP14+EGFR, TSH  Screening, anemia, deficiency, iron - Plan: CBC  Certainly having uncontrolled reactive depression and this is totally appropriate considering all that is going on.  I have switched her to mirtazapine since she had expressed some issues with sleep.  We discussed how to utilize this medication.  I renewed her Ativan to have on hand if she needs it.  She understands to continue using this judiciously.  Since we are continuing this I have completed urine drug screen and controlled substance contract for the patient.  She may follow-up with me in about 6 weeks to check up on how mirtazapine is going and we can advance dose pending that response.  I have also given her information on Gene[employer]ight testing should we need it  Fasting labs have been ordered for the patient.  She will come in at her body needs to have that done  No orders of the defined types were placed in this encounter.  No orders of the defined types were placed in this encounter.  Janora Norlander, DO [place] [phone]

## 2022-01-25 LAB — TOXASSURE SELECT 13 (MW), URINE

## 2022-02-11 ENCOUNTER — Ambulatory Visit: Payer: BC Managed Care – PPO | Admitting: Family Medicine

## 2022-02-23 DIAGNOSIS — Z85828 Personal history of other malignant neoplasm of skin: Secondary | ICD-10-CM | POA: Diagnosis not present

## 2022-02-23 DIAGNOSIS — L570 Actinic keratosis: Secondary | ICD-10-CM | POA: Diagnosis not present

## 2022-02-25 ENCOUNTER — Telehealth: Payer: Self-pay | Admitting: Family Medicine

## 2022-02-25 ENCOUNTER — Telehealth: Payer: BC Managed Care – PPO | Admitting: Family Medicine

## 2022-02-25 DIAGNOSIS — F411 Generalized anxiety disorder: Secondary | ICD-10-CM

## 2022-02-25 DIAGNOSIS — F329 Major depressive disorder, single episode, unspecified: Secondary | ICD-10-CM

## 2022-02-25 DIAGNOSIS — F430 Acute stress reaction: Secondary | ICD-10-CM

## 2022-02-25 MED ORDER — MIRTAZAPINE 7.5 MG PO TABS: 7.5000 mg | ORAL_TABLET | Freq: Every day | ORAL | 3 refills | Status: DC

## 2022-02-25 NOTE — Progress Notes (Signed)
MyChart Video visit ? ?Subjective: ?TI:[id number] ?PCP: Janora Norlander, DO ?[id number] H Couts is a 56 y.o. female. Patient provides verbal consent for consult held via video. ? ?Due to COVID-19 pandemic this visit was conducted virtually. This visit type was conducted due to national recommendations for restrictions regarding the COVID-19 Pandemic (e.g. social distancing, sheltering in place) in an effort to limit this patient's exposure and mitigate transmission in our community. All issues noted in this document were discussed and addressed.  A physical exam was not performed with this format.  ? ?Location of patient: work ?Location of provider: [facility] ?Others present for call: Jenny Reichmann, her friend ? ?1.  Follow-up anxiety, depression ?Patient was started on mirtazapine 7.5 mg at her last visit.  She notes that this has made a world of difference and she is sleeping a lot better at nighttime.  She reports her stomach has gotten better as well.  She feels like her anxiety has been so controlled that she really has not relied on the Ativan at all.  She has no concerns about the medications at this time and wishes to continue it.  Continues to have excellent support by her friend Jenny Reichmann ? ? ?ROS: Per HPI ? ?No Known Allergies ?Past Medical History:  ?Diagnosis Date  ? Allergic rhinitis   ? ? ?Current Outpatient Medications:  ?  fluticasone (FLONASE) 50 MCG/ACT nasal spray, USE 1 TO 2 SPRAY(S) IN EACH NOSTRIL ONCE DAILY, Disp: , Rfl:  ?  LORazepam (ATIVAN) 0.5 MG tablet, Take 1 tablet (0.5 mg total) by mouth 2 (two) times daily as needed for anxiety. CAUTION sedation, Disp: 30 tablet, Rfl: 1 ?  mirtazapine (REMERON) 7.5 MG tablet, Take 1 tablet (7.5 mg total) by mouth at bedtime., Disp: 30 tablet, Rfl: 1 ? ?Assessment/ Plan: ?56 y.o. female  ? ?Reactive depression - Plan: mirtazapine (REMERON) 7.5 MG tablet ? ?Anxiety in acute stress reaction - Plan: mirtazapine (REMERON) 7.5 MG tablet ? ?Symptoms have improved  dramatically with the mirtazapine 7.5 mg daily.  She has been really responding well to this medication and is pleased with the dose.  I have renewed the medication out for a year.  She still has as needed Ativan should she need it.  She will follow-up with me at normal schedule intervals, sooner if concerns arise ? ?Start time: 3:58pm ?End time: 4:03pm ? ?Total time spent on patient care (including video visit/ documentation): 5 minutes ? ?Janora Norlander, DO ?[place] ?([phone] ? ?

## 2022-03-13 DIAGNOSIS — Z01419 Encounter for gynecological examination (general) (routine) without abnormal findings: Secondary | ICD-10-CM | POA: Diagnosis not present

## 2022-03-13 DIAGNOSIS — Z6823 Body mass index (BMI) 23.0-23.9, adult: Secondary | ICD-10-CM | POA: Diagnosis not present

## 2022-08-11 DIAGNOSIS — Z1231 Encounter for screening mammogram for malignant neoplasm of breast: Secondary | ICD-10-CM | POA: Diagnosis not present

## 2022-08-26 DIAGNOSIS — L570 Actinic keratosis: Secondary | ICD-10-CM | POA: Diagnosis not present

## 2022-08-26 DIAGNOSIS — Z85828 Personal history of other malignant neoplasm of skin: Secondary | ICD-10-CM | POA: Diagnosis not present

## 2022-10-22 DIAGNOSIS — M79675 Pain in left toe(s): Secondary | ICD-10-CM | POA: Diagnosis not present

## 2022-10-22 DIAGNOSIS — L03032 Cellulitis of left toe: Secondary | ICD-10-CM | POA: Diagnosis not present

## 2022-10-23 ENCOUNTER — Telehealth: Payer: Self-pay | Admitting: Family Medicine

## 2022-10-23 NOTE — Telephone Encounter (Signed)
Pt aware to monitor symptoms as they come and as they go- unless she has a positive she does not need to be on antiviral. Aware to send covid test results if they are positive.

## 2022-10-23 NOTE — Telephone Encounter (Signed)
No vm set up. 

## 2023-01-14 ENCOUNTER — Telehealth (INDEPENDENT_AMBULATORY_CARE_PROVIDER_SITE_OTHER): Payer: BC Managed Care – PPO | Admitting: Family Medicine

## 2023-01-14 ENCOUNTER — Encounter: Payer: Self-pay | Admitting: Family Medicine

## 2023-01-14 DIAGNOSIS — F411 Generalized anxiety disorder: Secondary | ICD-10-CM | POA: Diagnosis not present

## 2023-01-14 MED ORDER — HYDROXYZINE PAMOATE 25 MG PO CAPS: 25.0000 mg | ORAL_CAPSULE | Freq: Three times a day (TID) | ORAL | 0 refills | Status: AC | PRN

## 2023-01-14 NOTE — Progress Notes (Signed)
Virtual Visit via MyChart Video Note Due to COVID-19 pandemic this visit was conducted virtually. This visit type was conducted due to national recommendations for restrictions regarding the COVID-19 Pandemic (e.g. social distancing, sheltering in place) in an effort to limit this patient's exposure and mitigate transmission in our community. All issues noted in this document were discussed and addressed.  A physical exam was not performed with this format.   I connected with Candice Lozano on 01/14/2023 at 1415 by MyChart Video and verified that I am speaking with the correct person using two identifiers. Candice Lozano is currently located at home and patient is currently with them during visit. The provider, Monia Pouch, FNP is located in their office at time of visit.  I discussed the limitations, risks, security and privacy concerns of performing an evaluation and management service by virtual visit and the availability of in person appointments. I also discussed with the patient that there may be a patient responsible charge related to this service. The patient expressed understanding and agreed to proceed.  Subjective:  Patient ID: Candice Lozano, female    DOB: 1966/06/13, 57 y.o.   MRN: [id number]  Chief Complaint:  Anxiety   HPI: Candice Lozano is a 57 y.o. female presenting on 01/14/2023 for Anxiety   Pt presents today for anxiety and panic attacks. States she is going through a divorce and has to meet with her lawyer soon and has been having anxiety attacks. She was taking as needed Ativan but has not followed up with her PCP in over 6 months. States she is no longer taking the mirtazapine as she states she does not get anxious often. She has not tried any other medications to calm her during times of anxiety.       Relevant past medical, surgical, family, and social history reviewed and updated as indicated.  Allergies and medications reviewed and  updated.   Past Medical History:  Diagnosis Date   Allergic rhinitis     Past Surgical History:  Procedure Laterality Date   NASAL SEPTUM SURGERY  2014    Social History   Socioeconomic History   Marital status: Married    Spouse name: Not on file   Number of children: Not on file   Years of education: Not on file   Highest education level: Not on file  Occupational History   Not on file  Tobacco Use   Smoking status: Every Day    Packs/day: 0.50    Years: 15.00    Total pack years: 7.50    Types: Cigarettes   Smokeless tobacco: Never  Vaping Use   Vaping Use: Never used  Substance and Sexual Activity   Alcohol use: Not on file    Comment: occ   Drug use: Not Currently   Sexual activity: Yes    Birth control/protection: I.U.D.  Other Topics Concern   Not on file  Social History Narrative   Patient is married.  She has no children.  She works locally at [employer]: Not on [employer] Insecurity: Not on file  Transportation Needs: Not on file  Physical Activity: Not on file  Stress: Not on file  Social Connections: Not on file  Intimate Partner Violence: Not on file    Outpatient Encounter Medications as of 01/14/2023  Medication Sig   hydrOXYzine (VISTARIL) 25 MG capsule Take 1 capsule (25 mg total) by mouth every  8 (eight) hours as needed.   LORazepam (ATIVAN) 0.5 MG tablet Take 1 tablet (0.5 mg total) by mouth 2 (two) times daily as needed for anxiety. CAUTION sedation   [DISCONTINUED] fluticasone (FLONASE) 50 MCG/ACT nasal spray USE 1 TO 2 SPRAY(S) IN EACH NOSTRIL ONCE DAILY   [DISCONTINUED] mirtazapine (REMERON) 7.5 MG tablet Take 1 tablet (7.5 mg total) by mouth at bedtime.   No facility-administered encounter medications on file as of 01/14/2023.    No Known Allergies  Review of Systems  Constitutional:  Negative for activity change, appetite change, chills, diaphoresis, fatigue, fever and  unexpected weight change.  HENT: Negative.    Eyes: Negative.  Negative for photophobia and visual disturbance.  Respiratory:  Negative for cough, chest tightness and shortness of breath.   Cardiovascular:  Positive for palpitations. Negative for chest pain and leg swelling.  Gastrointestinal:  Negative for abdominal pain, blood in stool, constipation, diarrhea, nausea and vomiting.  Endocrine: Negative.   Genitourinary:  Negative for decreased urine volume, difficulty urinating, dysuria, frequency and urgency.  Musculoskeletal:  Negative for arthralgias and myalgias.  Skin: Negative.   Allergic/Immunologic: Negative.   Neurological:  Negative for dizziness, tremors, seizures, syncope, facial asymmetry, speech difficulty, weakness, light-headedness, numbness and headaches.  Hematological: Negative.   Psychiatric/Behavioral:  Positive for sleep disturbance. Negative for agitation, behavioral problems, confusion, decreased concentration, dysphoric mood, hallucinations, self-injury and suicidal ideas. The patient is nervous/anxious. The patient is not hyperactive.   All other systems reviewed and are negative.        Observations/Objective: No vital signs or physical exam, this was a virtual health encounter.  Pt alert and oriented, answers all questions appropriately, and able to speak in full sentences.    Assessment and Plan: Merrillyn was seen today for anxiety.  Diagnoses and all orders for this visit:  Anxiety, generalized Will trial below as needed for significant anxiety. Aware of sedation precautions. Educated on proper use. Aware if this is not beneficial, she can make a follow up with her PCP for further management. Verbalized understanding.  -     hydrOXYzine (VISTARIL) 25 MG capsule; Take 1 capsule (25 mg total) by mouth every 8 (eight) hours as needed.     Follow Up Instructions: Return if symptoms worsen or fail to improve.    I discussed the assessment and  treatment plan with the patient. The patient was provided an opportunity to ask questions and all were answered. The patient agreed with the plan and demonstrated an understanding of the instructions.   The patient was advised to call back or seek an in-person evaluation if the symptoms worsen or if the condition fails to improve as anticipated.  The above assessment and management plan was discussed with the patient. The patient verbalized understanding of and has agreed to the management plan. Patient is aware to call the clinic if they develop any new symptoms or if symptoms persist or worsen. Patient is aware when to return to the clinic for a follow-up visit. Patient educated on when it is appropriate to go to the emergency department.    I provided 15 minutes of time during this MyChart Video encounter.   Monia Pouch, FNP-C [facility] [address] [place], [place] [address] [phone] 01/14/2023

## 2023-02-12 ENCOUNTER — Ambulatory Visit (INDEPENDENT_AMBULATORY_CARE_PROVIDER_SITE_OTHER): Payer: BC Managed Care – PPO | Admitting: Family Medicine

## 2023-02-12 ENCOUNTER — Encounter: Payer: Self-pay | Admitting: Family Medicine

## 2023-02-12 VITALS — BP 139/86 | HR 99 | Temp 98.6°F | Ht 64.0 in | Wt 136.0 lb

## 2023-02-12 DIAGNOSIS — F411 Generalized anxiety disorder: Secondary | ICD-10-CM

## 2023-02-12 DIAGNOSIS — F430 Acute stress reaction: Secondary | ICD-10-CM | POA: Diagnosis not present

## 2023-02-12 DIAGNOSIS — F410 Panic disorder [episodic paroxysmal anxiety] without agoraphobia: Secondary | ICD-10-CM | POA: Diagnosis not present

## 2023-02-12 DIAGNOSIS — Z79899 Other long term (current) drug therapy: Secondary | ICD-10-CM

## 2023-02-12 DIAGNOSIS — Z79891 Long term (current) use of opiate analgesic: Secondary | ICD-10-CM | POA: Diagnosis not present

## 2023-02-12 MED ORDER — LORAZEPAM 0.5 MG PO TABS: 0.5000 mg | ORAL_TABLET | Freq: Two times a day (BID) | ORAL | 1 refills | Status: AC | PRN

## 2023-02-12 NOTE — Progress Notes (Signed)
Subjective: [id number] attacks PCP: Janora Norlander, DO [id number] H Solarz is a 57 y.o. female presenting to clinic today for:  1.  Panic attacks Patient reports that she did trial the Atarax and it was helpful but required 2 doses and took longer onset than the Ativan.  She would prefer just to have a few tablets of Ativan.  She has the mediation for her divorce set up at the end of the month and she was certainly will need something to get her through that appointment.  She is looking forward to leaving this all behind her and getting on with her life.  She denies any emotional instability with regards to depression.  She feels like she has recovered quite well from that standpoint.  She simply carries a lot of stress and anxiety surrounding the upcoming divorce   ROS: Per HPI  No Known Allergies Past Medical History:  Diagnosis Date   Allergic rhinitis     Current Outpatient Medications:    hydrOXYzine (VISTARIL) 25 MG capsule, Take 1 capsule (25 mg total) by mouth every 8 (eight) hours as needed., Disp: 30 capsule, Rfl: 0   LORazepam (ATIVAN) 0.5 MG tablet, Take 1 tablet (0.5 mg total) by mouth 2 (two) times daily as needed for anxiety. CAUTION sedation, Disp: 30 tablet, Rfl: 1 Social History   Socioeconomic History   Marital status: Married    Spouse name: Not on file   Number of children: Not on file   Years of education: Not on file   Highest education level: Not on file  Occupational History   Not on file  Tobacco Use   Smoking status: Every Day    Packs/day: 0.50    Years: 15.00    Total pack years: 7.50    Types: Cigarettes   Smokeless tobacco: Never  Vaping Use   Vaping Use: Never used  Substance and Sexual Activity   Alcohol use: Not on file    Comment: occ   Drug use: Not Currently   Sexual activity: Yes    Birth control/protection: I.U.D.  Other Topics Concern   Not on file  Social History Narrative   Patient is married.  She has no children.   She works locally at [employer]: Not on [employer] Insecurity: Not on file  Transportation Needs: Not on file  Physical Activity: Not on file  Stress: Not on file  Social Connections: Not on file  Intimate Partner Violence: Not on file   Family History  Problem Relation Age of Onset   Diabetes Mother    Diabetes Mellitus II Mother    Lung cancer Father    Brain cancer Maternal Grandmother    Heart attack Maternal Grandfather    Congestive Heart Failure Maternal Grandfather    Aneurysm Paternal Grandmother     Objective: Office vital signs reviewed. BP 139/86   Pulse 99   Temp 98.6 F (37 C)   Ht '5\' 4"'$  (1.626 m)   Wt 136 lb (61.7 kg)   SpO2 98%   BMI 23.34 kg/m   Physical Examination:  General: Awake, alert, well nourished, No acute distress HEENT: sclera white, MMM Cardio: regular rate and rhythm  Pulm: Normal work of breathing on room air Psych: Mood stable but anxious when talking about upcoming mediation.  Assessment/ Plan: 57 y.o. female   Generalized anxiety disorder with panic attacks - Plan: ToxASSURE Select 13 (MW), Urine  Controlled substance agreement signed - Plan: ToxASSURE Select 13 (MW), Urine  Anxiety in acute stress reaction - Plan: LORazepam (ATIVAN) 0.5 MG tablet  Ativan appropriate for as needed use.  This has been renewed.  UDS and CSC were updated as per office policy.  The national narcotic database reviewed and there were no red flags  Orders Placed This Encounter  Procedures   ToxASSURE Select 13 (MW), Urine    Current Outpatient Medications:    hydrOXYzine (VISTARIL) 25 MG capsule, Take 1 capsule (25 mg total) by mouth every 8 (eight) hours as needed., Disp: 30 capsule, Rfl: 0   LORazepam (ATIVAN) 0.5 MG tablet, Take 1 tablet (0.5 mg total) by mouth 2 (two) times daily as needed for anxiety. CAUTION sedation, Disp: 30 tablet, Rfl: 1   Meds ordered this encounter  Medications    LORazepam (ATIVAN) 0.5 MG tablet    Sig: Take 1 tablet (0.5 mg total) by mouth 2 (two) times daily as needed for anxiety. CAUTION sedation    Dispense:  30 tablet    Refill:  [place], DO [place] [phone]

## 2023-02-16 LAB — TOXASSURE SELECT 13 (MW), URINE

## 2023-02-22 NOTE — Progress Notes (Signed)
R/c

## 2023-02-24 DIAGNOSIS — Z85828 Personal history of other malignant neoplasm of skin: Secondary | ICD-10-CM | POA: Diagnosis not present

## 2023-02-24 DIAGNOSIS — C44319 Basal cell carcinoma of skin of other parts of face: Secondary | ICD-10-CM | POA: Diagnosis not present

## 2023-02-24 DIAGNOSIS — D485 Neoplasm of uncertain behavior of skin: Secondary | ICD-10-CM | POA: Diagnosis not present

## 2023-02-24 DIAGNOSIS — Z1283 Encounter for screening for malignant neoplasm of skin: Secondary | ICD-10-CM | POA: Diagnosis not present

## 2023-02-26 ENCOUNTER — Telehealth: Payer: Self-pay

## 2023-02-26 NOTE — Telephone Encounter (Signed)
Pt has been notified - she states she tried it 2 times but she is no longer on it , stated it made her feel really bad

## 2023-03-02 DIAGNOSIS — C44319 Basal cell carcinoma of skin of other parts of face: Secondary | ICD-10-CM | POA: Diagnosis not present

## 2023-04-20 DIAGNOSIS — H40013 Open angle with borderline findings, low risk, bilateral: Secondary | ICD-10-CM | POA: Diagnosis not present

## 2023-06-30 DIAGNOSIS — Z30431 Encounter for routine checking of intrauterine contraceptive device: Secondary | ICD-10-CM | POA: Diagnosis not present

## 2023-06-30 DIAGNOSIS — Z6821 Body mass index (BMI) 21.0-21.9, adult: Secondary | ICD-10-CM | POA: Diagnosis not present

## 2023-06-30 DIAGNOSIS — Z01419 Encounter for gynecological examination (general) (routine) without abnormal findings: Secondary | ICD-10-CM | POA: Diagnosis not present

## 2023-08-10 DIAGNOSIS — R92323 Mammographic fibroglandular density, bilateral breasts: Secondary | ICD-10-CM | POA: Diagnosis not present

## 2023-08-10 DIAGNOSIS — Z1231 Encounter for screening mammogram for malignant neoplasm of breast: Secondary | ICD-10-CM | POA: Diagnosis not present

## 2023-08-10 LAB — HM MAMMOGRAPHY

## 2023-08-13 ENCOUNTER — Encounter: Payer: Self-pay | Admitting: Family Medicine

## 2023-08-31 DIAGNOSIS — L570 Actinic keratosis: Secondary | ICD-10-CM | POA: Diagnosis not present

## 2023-08-31 DIAGNOSIS — D485 Neoplasm of uncertain behavior of skin: Secondary | ICD-10-CM | POA: Diagnosis not present

## 2024-02-28 DIAGNOSIS — L821 Other seborrheic keratosis: Secondary | ICD-10-CM | POA: Diagnosis not present

## 2024-03-23 ENCOUNTER — Ambulatory Visit: Admitting: Family Medicine

## 2024-03-23 ENCOUNTER — Encounter: Payer: Self-pay | Admitting: Family Medicine

## 2024-03-23 VITALS — BP 137/87 | HR 65 | Temp 98.4°F | Ht 64.0 in | Wt 135.0 lb

## 2024-03-23 DIAGNOSIS — W57XXXA Bitten or stung by nonvenomous insect and other nonvenomous arthropods, initial encounter: Secondary | ICD-10-CM | POA: Diagnosis not present

## 2024-03-23 DIAGNOSIS — S40261A Insect bite (nonvenomous) of right shoulder, initial encounter: Secondary | ICD-10-CM

## 2024-03-23 MED ORDER — FLUCONAZOLE 150 MG PO TABS: 150.0000 mg | ORAL_TABLET | Freq: Once | ORAL | 0 refills | Status: AC

## 2024-03-23 NOTE — Progress Notes (Signed)
 Subjective: CC: Tick bite PCP: Raliegh Ip, DO ZOX:[id number] Candice Lozano is a 58 y.o. female presenting to clinic today for:  1.  Tick bite Patient was bitten by a tick and she noticed on Monday that was on her right shoulder.  She had been out and about on Sunday.  Her work nurse prescribed her doxycycline but they were worried about the location of it.  She reports that the shoulder developed a gumball sized knot that has gotten smaller since she started the antibiotic.  She reports no fevers, chills, nausea, vomiting.   ROS: Per HPI  No Known Allergies Past Medical History:  Diagnosis Date   Allergic rhinitis     Current Outpatient Medications:    hydrOXYzine (VISTARIL) 25 MG capsule, Take 1 capsule (25 mg total) by mouth every 8 (eight) hours as needed., Disp: 30 capsule, Rfl: 0   LORazepam (ATIVAN) 0.5 MG tablet, Take 1 tablet (0.5 mg total) by mouth 2 (two) times daily as needed for anxiety. CAUTION sedation, Disp: 30 tablet, Rfl: 1 Social History   Socioeconomic History   Marital status: Married    Spouse name: Not on file   Number of children: Not on file   Years of education: Not on file   Highest education level: Not on file  Occupational History   Not on file  Tobacco Use   Smoking status: Every Day    Current packs/day: 0.50    Average packs/day: 0.5 packs/day for 15.0 years (7.5 ttl pk-yrs)    Types: Cigarettes   Smokeless tobacco: Never  Vaping Use   Vaping status: Never Used  Substance and Sexual Activity   Alcohol use: Not on file    Comment: occ   Drug use: Not Currently   Sexual activity: Yes    Birth control/protection: I.U.D.  Other Topics Concern   Not on file  Social History Narrative   Patient is married.  She has no children.  She works locally at [employer]: Not on [employer] Insecurity: No Food Insecurity (02/07/2020)   Received from [facility], [facility]   Hunger  Vital Sign    Worried About Running Out of Food in the Last Year: Never true    Ran Out of Food in the Last Year: Never true  Transportation Needs: No Transportation Needs (02/07/2020)   Received from [facility], [facility]   [facility] - Transportation    Lack of Transportation (Medical): No    Lack of Transportation (Non-Medical): No  Physical Activity: Insufficiently Active (06/30/2023)   Received from [facility]   Exercise Vital Sign    Days of Exercise per Week: 3 days    Minutes of Exercise per Session: 30 min  Stress: Stress Concern Present (06/30/2023)   Received from [facility] - Occupational Stress Questionnaire    Feeling of Stress : To some extent  Social Connections: Unknown (04/25/2022)   Received from [facility], [facility]   Social Network    Social Network: Not on file  Intimate Partner Violence: Not At Risk (06/30/2023)   Received from [facility]   Humiliation, Afraid, Rape, and Kick questionnaire    Fear of Current or Ex-Partner: No    Emotionally Abused: No    Physically Abused: No    Sexually Abused: No   Family History  Problem Relation Age  of Onset   Diabetes Mother    Diabetes Mellitus II Mother    Lung cancer Father    Brain cancer Maternal Grandmother    Heart attack Maternal Grandfather    Congestive Heart Failure Maternal Grandfather    Aneurysm Paternal Grandmother     Objective: Office vital signs reviewed. BP 137/87   Pulse 65   Temp 98.4 F (36.9 C)   Ht 5\' 4"  (1.626 m)   Wt 135 lb (61.2 kg)   SpO2 96%   BMI 23.17 kg/m   Physical Examination:  General: Awake, alert, well nourished, No acute distress Skin: Right superior anterior shoulder just medial to the [facility] joint is a gumball sized area of soft tissue swelling that is well-circumscribed, nontender.  She has a central punctum with no active drainage.  There is slight erythema surrounding the central punctum but no  palpable induration.  Assessment/ Plan: 58 y.o. female   Tick bite of right shoulder, initial encounter - Plan: fluconazole (DIFLUCAN) 150 MG tablet  Agree with use of doxycycline twice daily for 14 days.  We discussed concerning signs and symptoms including signs of joint infection.  She has no evidence of that at this time.  We discussed use of warm compresses to promote drainage of the area.  We discussed indications for return and including I&D.  She voiced good understanding.  Diflucan sent should she need it for medication induced yeast vaginitis.  Raliegh Ip, DO [facility] [phone]

## 2024-03-23 NOTE — Patient Instructions (Signed)
Tick Bite Information, Adult  Ticks are insects that can bite. Most ticks live in bushes and grassy areas. They climb onto people and animals that go by. Then they bite. Some ticks carry germs that can make you sick. How can I prevent tick bites? Take these steps: Before you go outside: Wear long sleeves and long pants. Wear light-colored clothes. Tuck your pant legs into your socks. Use an insect repellent that has 20% or higher of the ingredients DEET, picaridin, or IR3535. Follow the instructions on the label. Put it on: Bare skin. Avoid your eyes and mouth areas. The tops of your boots. Your pant legs. The ends of your sleeves. If you use an insect repellent that has the ingredient permethrin, follow the instructions on the label. Do not put permethrin on the skin. Put it on: Clothing. Shoes. Outdoor gear. Tents. When you are outside Avoid walking through long grass. Stay in the middle of the trail. Do not touch the bushes. Check for ticks on your clothes, hair, and skin often while you are outside. Check again before you go inside. When you go indoors Check your clothes for ticks. Dry your clothes in a dryer on high heat for 10 minutes or more. If clothes are damp, more time may be needed. Wash your clothes right away if they need to be washed. Use hot water. Check your pets and outdoor gear. Shower right away. Check your body for ticks. Do a full body check using a mirror. Check your clothes, skin, head, neck, armpits, waist, groin, and joint areas. What is the right way to remove a tick? Remove the tick from your skin as soon as possible. Do not remove the tick with your bare fingers. Do not try to remove a tick with heat, alcohol, petroleum jelly, or fingernail polish. To remove a tick that is crawling on your skin: Go outside and brush the tick off. Use tape or a lint roller. To remove a tick that is biting: Wash your hands. If you have gloves, put them on. Use tweezers,  curved forceps, or a tick-removal tool to grasp the tick. Grasp the tick as close to your skin and as close to the tick's head as possible. Gently pull up until the tick lets go. Try to keep the tick's head attached to its body. Do not twist or jerk the tick. Do not squeeze or crush the tick. What should I do after taking out a tick? Clean the bite area and your hands with soap and water, rubbing alcohol, or an iodine wash. If you have an antiseptic cream or ointment, put a small amount on the bite area. Wash and disinfect any instruments that you used to remove the tick. How should I get rid of a live tick? To get rid of a live tick, use one of these methods: Place the tick in rubbing alcohol. Place it in a bag or container you can close tightly. Throw it away. Wrap it tightly in tape. Throw it away. Flush it down the toilet. Where to find more information Centers for Disease Control and Prevention: cdc.gov/ticks U.[place]. Environmental Protection Agency: epa.gov/insect-repellents Contact a doctor if: You have a fever or chills. You have a red rash that makes a circle (bull's-eye rash) in the bite area. You have redness and swelling where the tick bit you. You have a headache or stiff neck. You have pain in a muscle, joint, or bone. You are more tired than normal. You have trouble walking or   moving your legs. You have numbness in your legs. You have tender or swollen lymph glands. You have belly (abdominal) pain, vomiting, watery poop (diarrhea), or weight loss. Get help right away if: You cannot remove a tick. You cannot move (have paralysis) or feel weak. You are feeling worse or have new symptoms. You find a tick that is biting you and filled with blood, especially if you are in an area where diseases from ticks are common. Summary Ticks may carry germs that can make you sick. To prevent tick bites wear long sleeves, long pants, and light colors. Use insect repellent. Follow the  instructions on the label. If the tick is biting, remove it right away. Do not try to remove it with heat, alcohol, petroleum jelly, or fingernail polish. Contact a doctor if you have symptoms of a disease after being bitten by a tick. This information is not intended to replace advice given to you by your health care provider. Make sure you discuss any questions you have with your health care provider. Document Revised: 03/02/2022 Document Reviewed: 03/02/2022 Elsevier Patient Education  2024 [employer]  

## 2024-06-20 LAB — LAB REPORT - SCANNED
A1c: 5.8
EGFR: 80

## 2024-08-15 DIAGNOSIS — B351 Tinea unguium: Secondary | ICD-10-CM | POA: Diagnosis not present

## 2024-08-15 DIAGNOSIS — Z1231 Encounter for screening mammogram for malignant neoplasm of breast: Secondary | ICD-10-CM | POA: Diagnosis not present

## 2024-08-15 DIAGNOSIS — R92323 Mammographic fibroglandular density, bilateral breasts: Secondary | ICD-10-CM | POA: Diagnosis not present

## 2024-08-15 DIAGNOSIS — M79674 Pain in right toe(s): Secondary | ICD-10-CM | POA: Diagnosis not present

## 2024-08-15 DIAGNOSIS — M79675 Pain in left toe(s): Secondary | ICD-10-CM | POA: Diagnosis not present

## 2024-08-15 LAB — HM MAMMOGRAPHY

## 2024-08-16 ENCOUNTER — Encounter: Payer: Self-pay | Admitting: Family Medicine

## 2024-08-16 DIAGNOSIS — B351 Tinea unguium: Secondary | ICD-10-CM | POA: Diagnosis not present

## 2024-08-30 DIAGNOSIS — I781 Nevus, non-neoplastic: Secondary | ICD-10-CM | POA: Diagnosis not present

## 2024-10-03 LAB — LAB REPORT - SCANNED
A1c: 6
EGFR: 82
